# Patient Record
Sex: Male | Born: 1945 | Race: White | Hispanic: No | Marital: Married
Health system: Western US, Academic
[De-identification: ages and names within clinical notes are randomized; demographics above are authoritative.]

---

## 2018-04-18 ENCOUNTER — Emergency Department
Admission: EM | Admit: 2018-04-18 | Discharge: 2018-04-18 | Disposition: A | Discharge status: Home / Self Care | Payer: Medicare Other | Attending: Emergency Medicine | Admitting: Emergency Medicine

## 2018-04-18 ENCOUNTER — Emergency Department (EMERGENCY_DEPARTMENT_HOSPITAL): Payer: Medicare Other

## 2018-04-18 DIAGNOSIS — Z87820 Personal history of traumatic brain injury: Secondary | ICD-10-CM | POA: Insufficient documentation

## 2018-04-18 DIAGNOSIS — Z9889 Other specified postprocedural states: Secondary | ICD-10-CM

## 2018-04-18 DIAGNOSIS — G9389 Other specified disorders of brain: Secondary | ICD-10-CM

## 2018-04-18 DIAGNOSIS — E785 Hyperlipidemia, unspecified: Secondary | ICD-10-CM | POA: Insufficient documentation

## 2018-04-18 DIAGNOSIS — R42 Dizziness and giddiness: Secondary | ICD-10-CM

## 2018-04-18 DIAGNOSIS — Z8673 Personal history of transient ischemic attack (TIA), and cerebral infarction without residual deficits: Secondary | ICD-10-CM

## 2018-04-18 DIAGNOSIS — Y92410 Unspecified street and highway as the place of occurrence of the external cause: Secondary | ICD-10-CM | POA: Insufficient documentation

## 2018-04-18 LAB — CBC WITH DIFF, BLOOD
ANC-Automated: 2.3 10*3/uL (ref 1.6–7.0)
Abs Basophils: 0 10*3/uL (ref <0.1)
Abs Eosinophils: 0.1 10*3/uL (ref 0.1–0.5)
Abs Lymphs: 0.9 10*3/uL (ref 0.8–3.1)
Abs Monos: 0.3 10*3/uL (ref 0.2–0.8)
Basophils: 0 %
Eosinophils: 3 %
Hct: 46 % (ref 40.0–50.0)
Hgb: 15.4 gm/dL (ref 13.7–17.5)
Lymphocytes: 24 %
MCH: 28.7 pg (ref 26.0–32.0)
MCHC: 33.5 g/dL (ref 32.0–36.0)
MCV: 85.7 um3 (ref 79.0–95.0)
MPV: 11.1 fL (ref 9.4–12.4)
Monocytes: 9 %
Plt Count: 156 10*3/uL (ref 140–370)
RBC: 5.37 10*6/uL (ref 4.60–6.10)
RDW: 13.2 % (ref 12.0–14.0)
Segs: 64 %
WBC: 3.7 10*3/uL — ABNORMAL LOW (ref 4.0–10.0)

## 2018-04-18 LAB — COMPREHENSIVE METABOLIC PANEL, BLOOD
ALT (SGPT): 25 U/L (ref 0–41)
AST (SGOT): 25 U/L (ref 0–40)
Albumin: 4.3 g/dL (ref 3.5–5.2)
Alkaline Phos: 67 U/L (ref 40–129)
Anion Gap: 9 mmol/L (ref 7–15)
BUN: 18 mg/dL (ref 8–23)
Bicarbonate: 24 mmol/L (ref 22–29)
Bilirubin, Tot: 0.35 mg/dL (ref <1.2)
Calcium: 9.1 mg/dL (ref 8.5–10.6)
Chloride: 107 mmol/L (ref 98–107)
Creatinine: 0.88 mg/dL (ref 0.67–1.17)
GFR: >60 mL/min
Glucose: 115 mg/dL — ABNORMAL HIGH (ref 70–99)
Potassium: 4.1 mmol/L (ref 3.5–5.1)
Sodium: 140 mmol/L (ref 136–145)
Total Protein: 6.7 g/dL (ref 6.0–8.0)

## 2018-04-18 LAB — ECG 12-LEAD
ATRIAL RATE: 68 {beats}/min
ECG INTERPRETATION: NORMAL
P AXIS: 36 degrees
PR INTERVAL: 170 ms
QRS INTERVAL/DURATION: 92 ms
QT: 392 ms
QTC INTERVAL: 416 ms
R AXIS: 27 degrees
T AXIS: 39 degrees
VENTRICULAR RATE: 68 {beats}/min

## 2018-04-18 LAB — HCV ANTIBODY WITH REFLEX QUANT: Hepatitis C Ab: NONREACTIVE
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
## 2018-04-18 NOTE — ED Notes (Signed)
04/18/2018 8:08 AM Henry Jacobs    An EKG was handed to Dr. Jessee AversNene.

## 2018-04-20 NOTE — ED Follow-up Note (Signed)
Follow-up type: Callback       Routine ED Patient Call Back    Patient contacted by telephone:  found no issues; patient doing fine. No recurrence of headache, denies n/v/dizziness, weakness. Advised PMD follow up once returns home.      Elon Spannerahul Onisha Cedeno, MD, PhD  Emergency Medicine, PGY-3

## 2019-11-01 ENCOUNTER — Encounter (INDEPENDENT_AMBULATORY_CARE_PROVIDER_SITE_OTHER): Payer: Self-pay

## 2021-05-07 IMAGING — CR XR CHEST 1 VIEW
1 series · 1 of 1 positions shown · non-contrast
Comparison: none

FINAL REPORT:
INDICATION: Near syncope. Chest pain.
Procedure: Portable chest.
Comparisons: None

[AP]
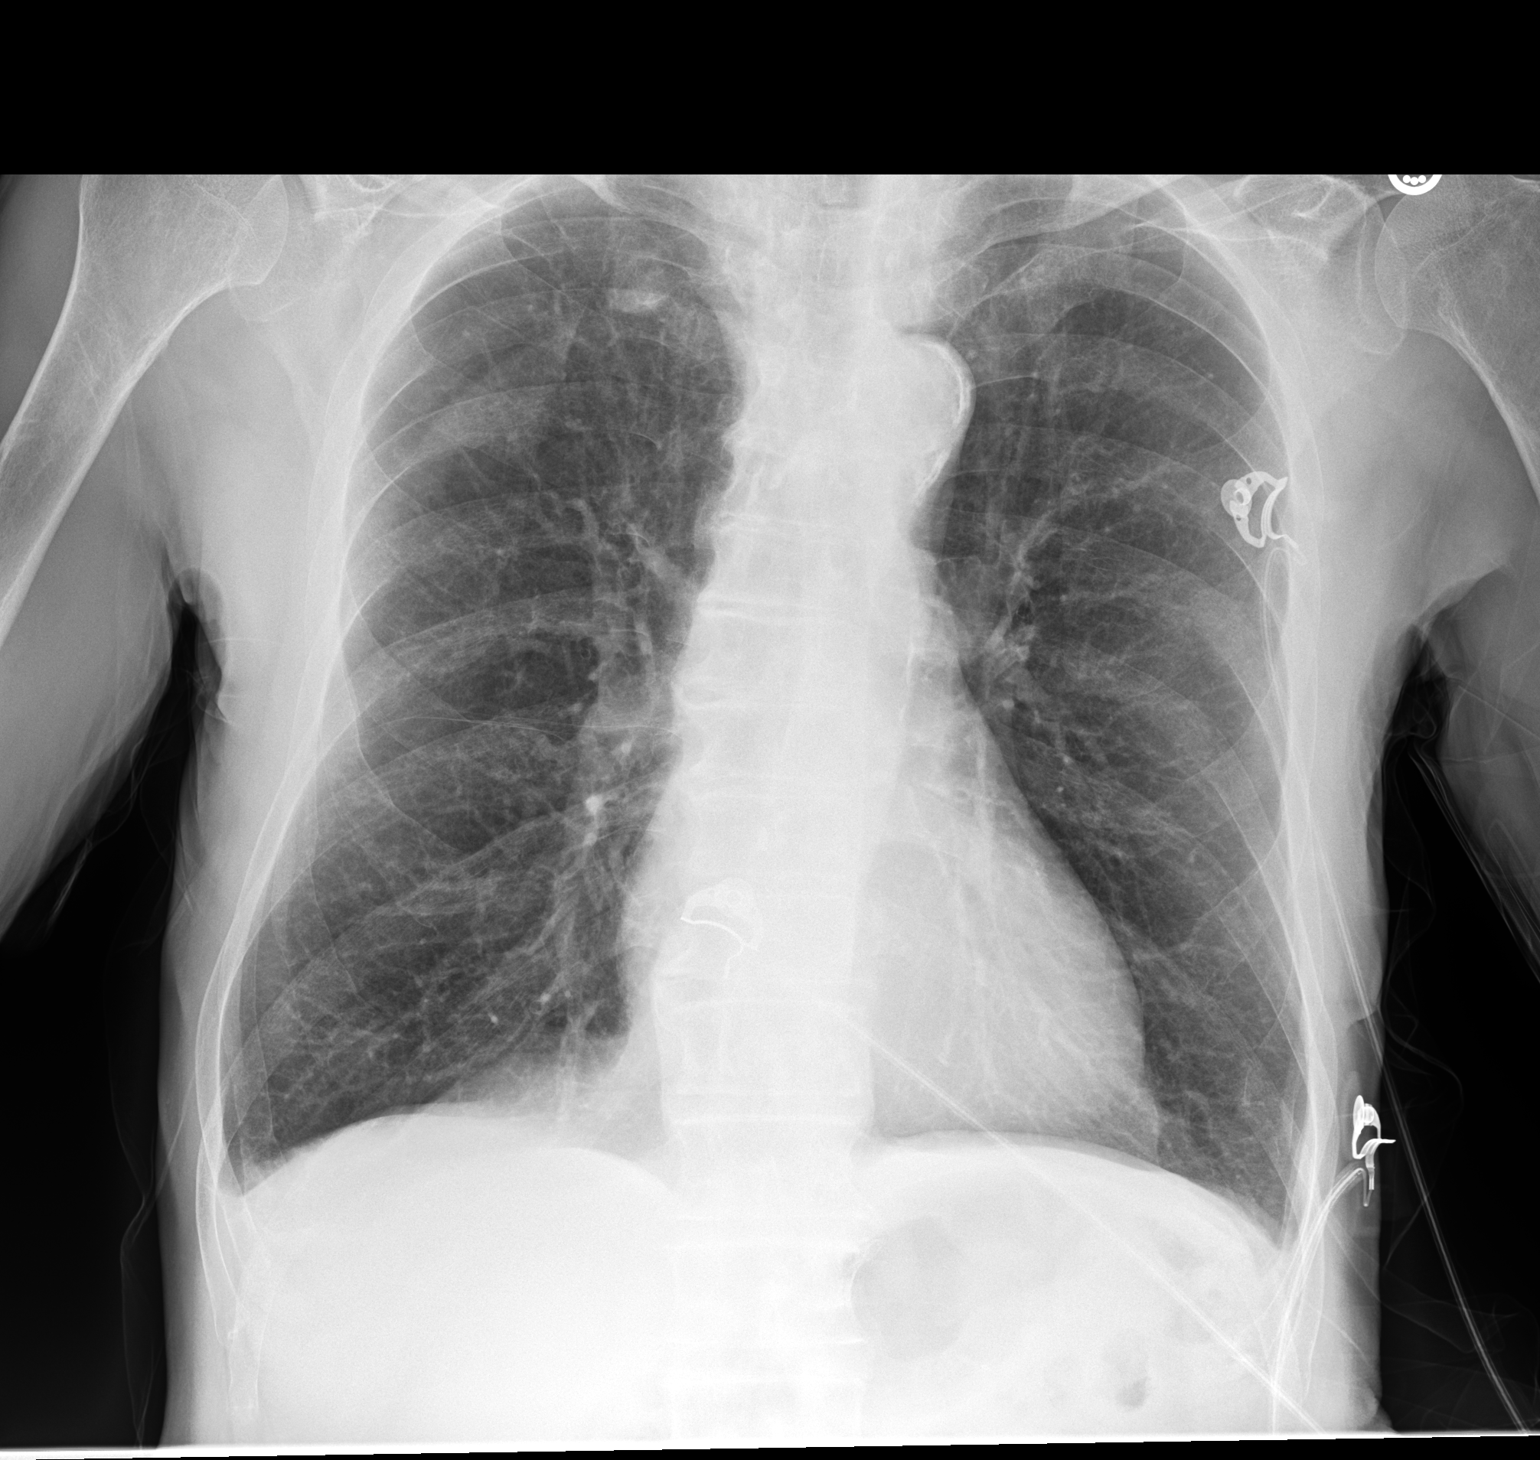

[1 of 1 positions shown; findings below may reference images not displayed]

FINDINGS: The lungs are symmetrically hyperinflated and clear. The cardiac silhouette size is within normal limits.  No pulmonary edema. The CP angles are sharp. There is no evidence of a pneumothorax.
IMPRESSION: 
IMPRESSION: No acute chest disease demonstrated.

## 2021-08-28 IMAGING — CT CT HEAD WITHOUT CONTRAST
2 of 3 series · 15 of 40 positions shown, 18 images · non-contrast
Comparison: none

Numbness
No sx on head
Bladder cancer, sx
Addendum:
(#[id number]
<[HOSPITAL]>
Communicated to: Dr. Najy
On behalf of: Dr. Brad Raymundo
By: Melynda Billiot
At: [DATE]
On: 08/28/2021
</[HOSPITAL]>#)
FINAL REPORT:
CT HEAD WITHOUT CONTRAST
INDICATION: Neuro deficit, acute, stroke suspected
TECHNIQUE: Helical CT images from skull base to vertex without IV contrast. Dose reduction techniques were utilized for this examination.

[Series 2: head stnd · axial · 0.44mm/px · z∈[+29,+163]mm · 12 of 32 slices shown, 15 images]
[im 3/32  brain]
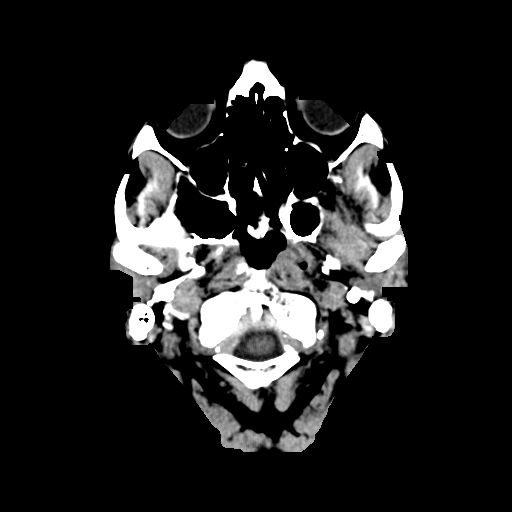
[im 3/32  bone]
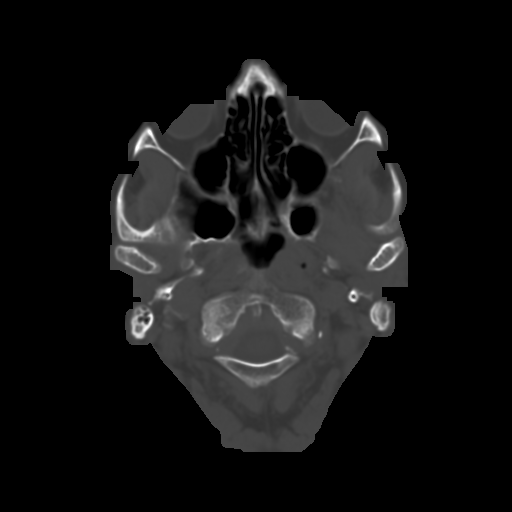
[im 5/32  brain]
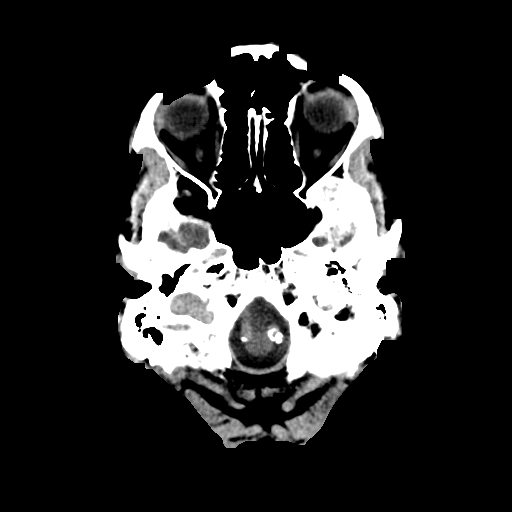
[im 7/32  brain]
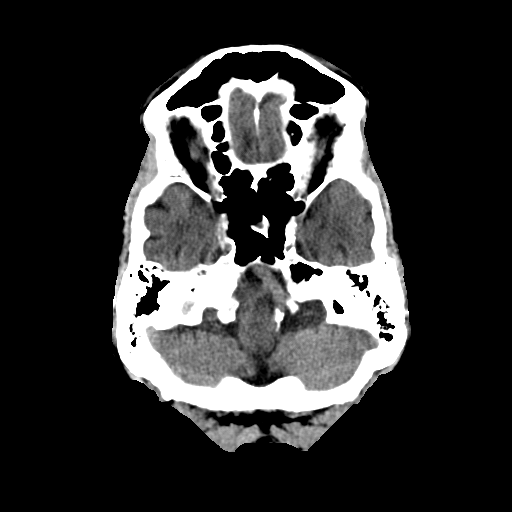
[im 10/32  brain]
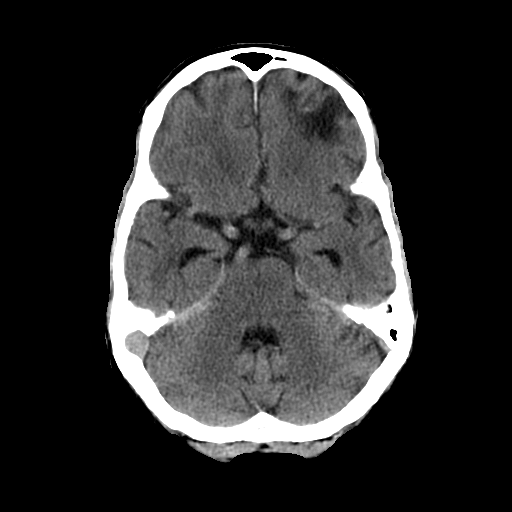
[im 12/32  brain]
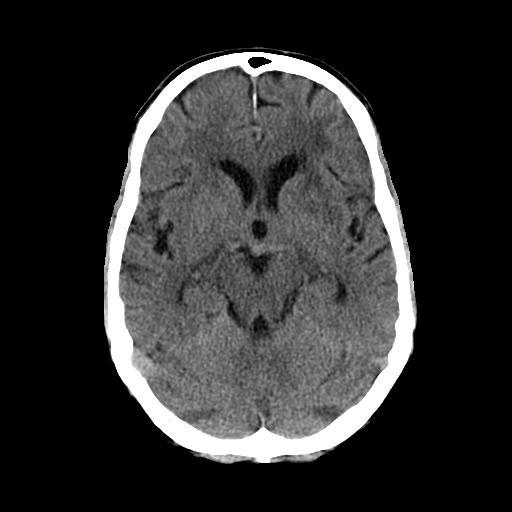
[im 12/32  bone]
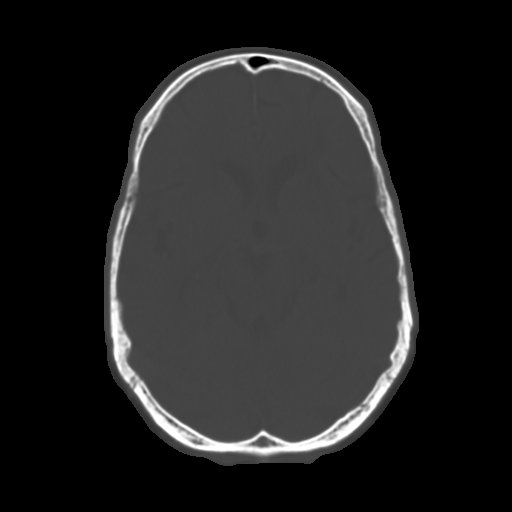
[im 14/32  brain]
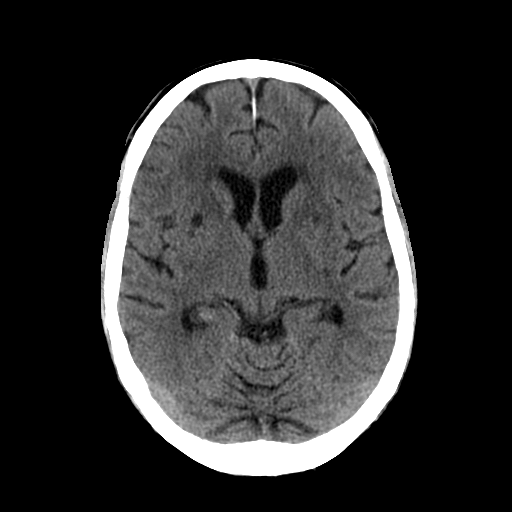
[im 18/32  brain]
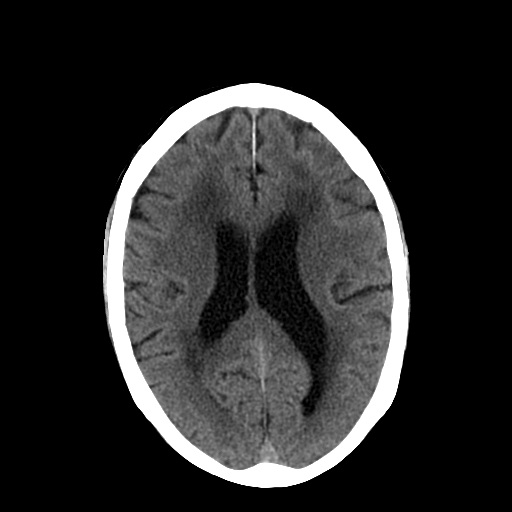
[im 20/32  brain]
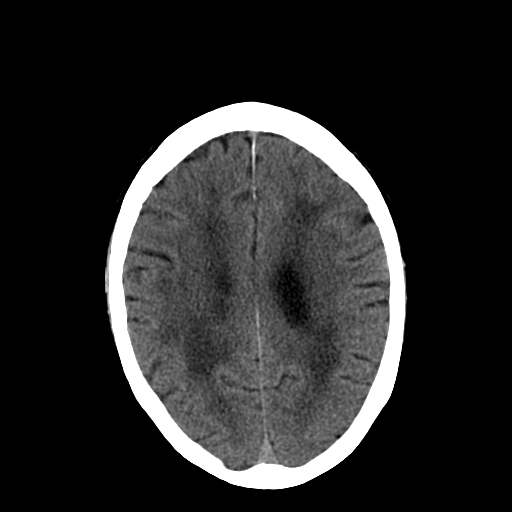
[im 22/32  brain]
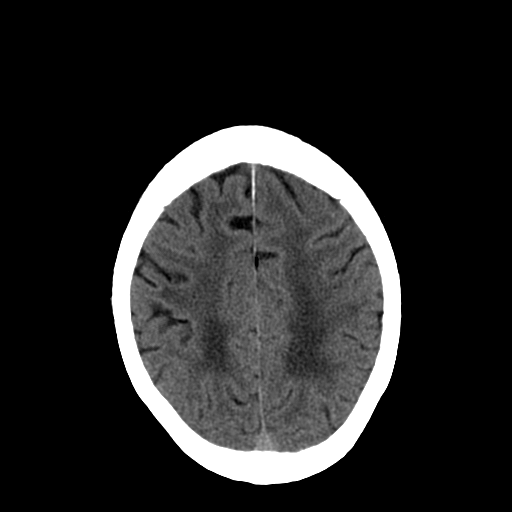
[im 22/32  bone]
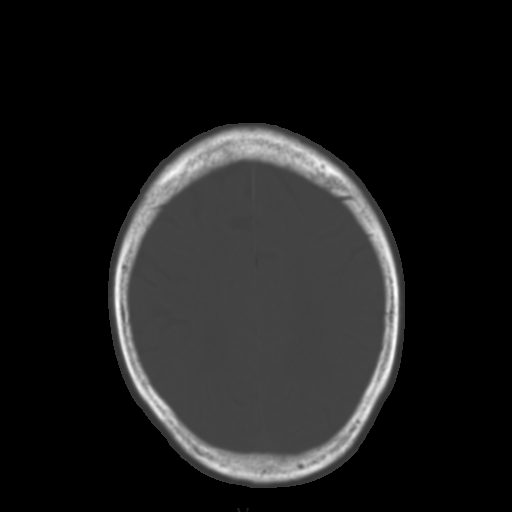
[im 25/32  brain]
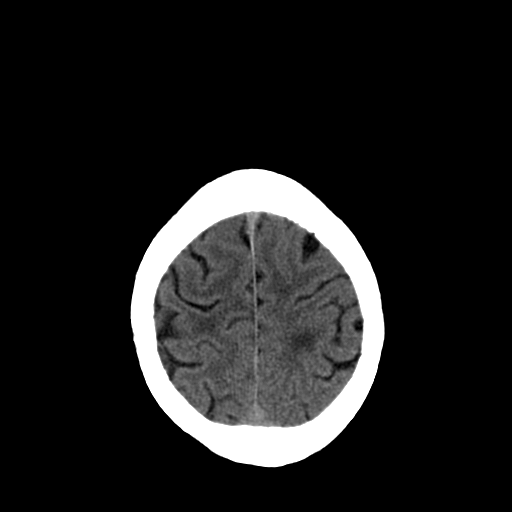
[im 27/32  brain]
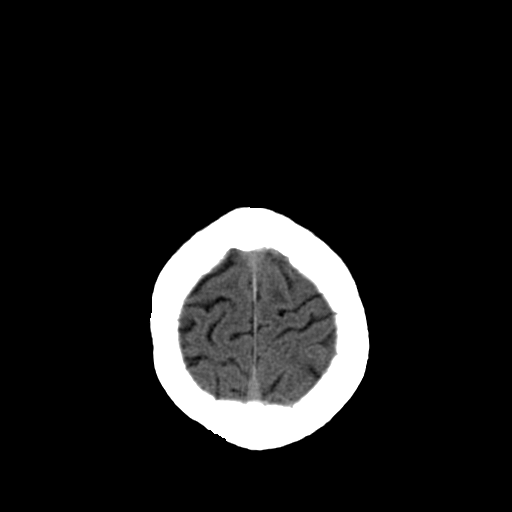
[im 29/32  brain]
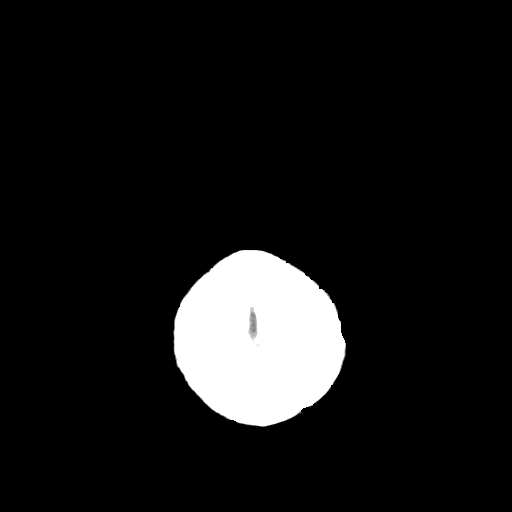

[Series 601: cor head · coronal · 0.44mm/px · 3 of 103 slices shown]
[im 28/103  brain]
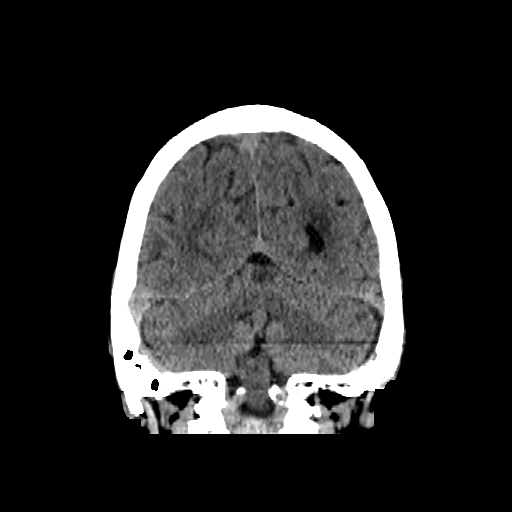
[im 40/103  brain]
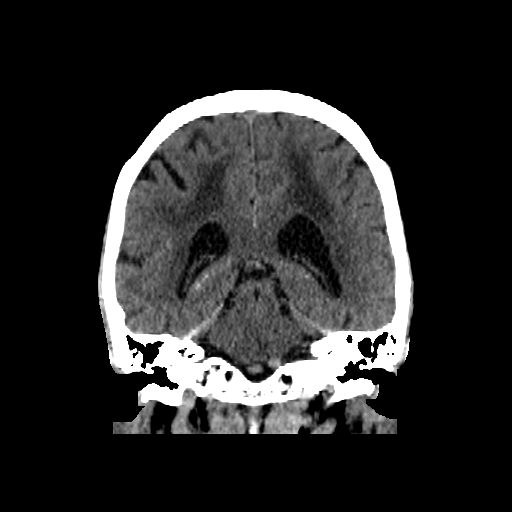
[im 53/103  brain]
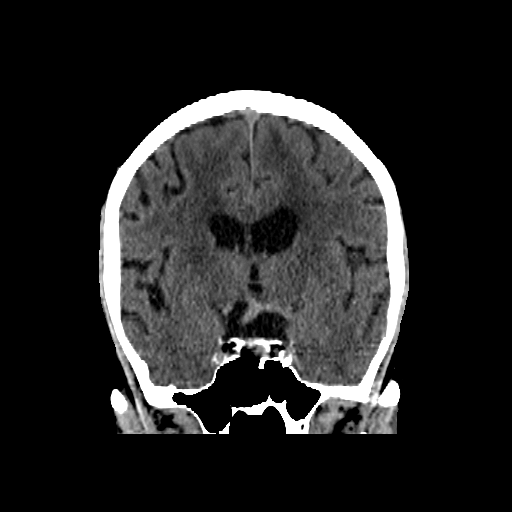

[15 of 40 positions shown; findings below may reference images not displayed]

FINDINGS: Parenchyma: No acute large territory infarction. No acute hemorrhage. No mass or mass effect. Patchy and confluent areas of hypoattenuation are present in the cerebral white matter that are nonspecific but compatible with moderate chronic microvascular ischemic changes.
Encephalomalacia in the left anterior-inferior frontal lobe.
Old lacunar infarctions in the bilateral basal ganglia.
Extra-axial Collection:  None
Ventricular System:  Normal
Dural Venous Sinuses:  Normal
Osseous Structures:  Normal
Included Orbits: Normal
Paranasal Sinuses:  Predominantly clear
Tympanomastoid Cavities:  Normal
Other:  Atherosclerotic intracranial calcifications are present.
IMPRESSION: :
No acute intracranial abnormality.
Moderate chronic white matter microvascular ischemic changes.
Old infarction in the left anterior inferior frontal lobe. Old lacunar infarctions in the bilateral basal ganglia.

## 2022-05-07 IMAGING — CR XR CHEST 1 VIEW
1 series · 1 of 1 positions shown · non-contrast
Comparison: Chest x-ray from 05/07/2021

FINAL REPORT:
INDICATION: fall pain

[AP]
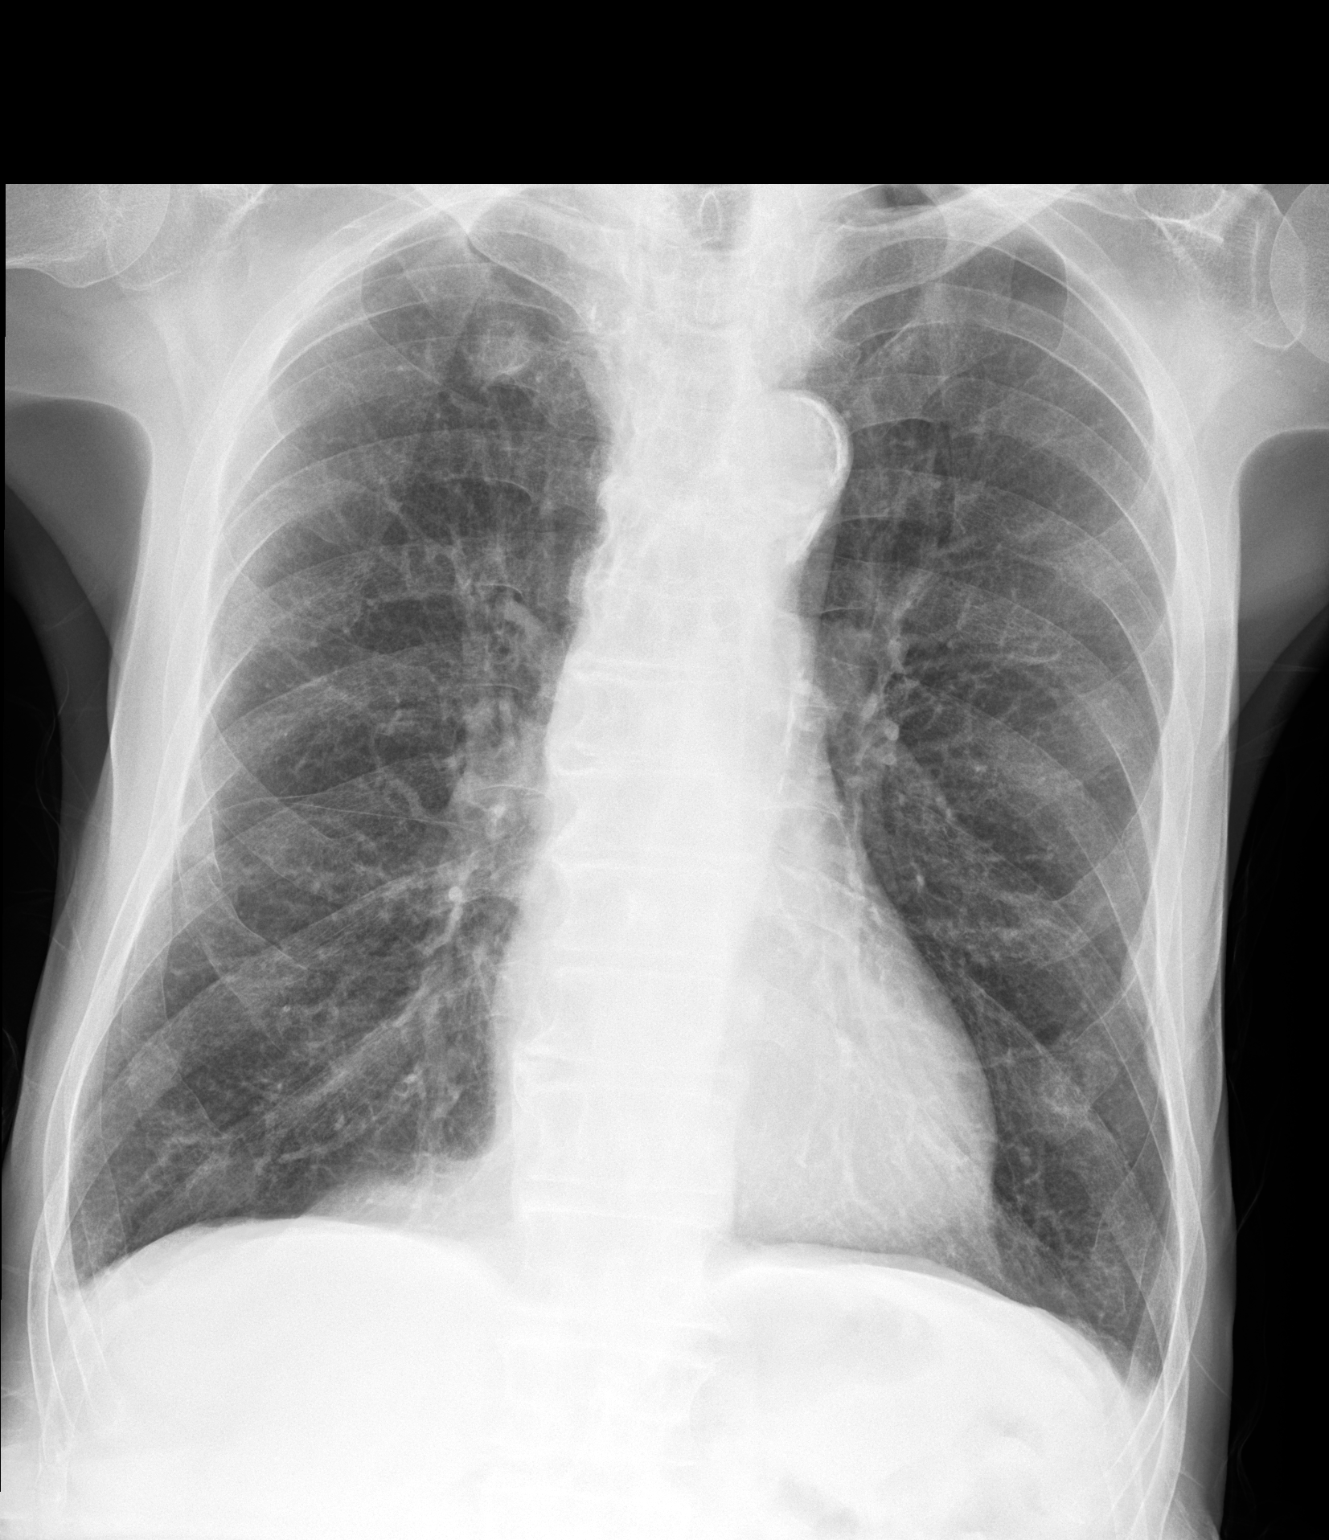

[1 of 1 positions shown; findings below may reference images not displayed]

FINDINGS: 1 view of the chest. The cardiomediastinal contours are within normal limits. No evidence of focal consolidation, pleural effusion, pulmonary edema, or pneumothorax.
IMPRESSION: 
IMPRESSION: No acute cardiopulmonary findings.

## 2022-05-18 IMAGING — CR XR CHEST 1 VIEW
1 series · 1 of 1 positions shown · non-contrast
Comparison: 05/07/2022

FINAL REPORT:
Chest portable one view
INDICATION: cp

[AP]
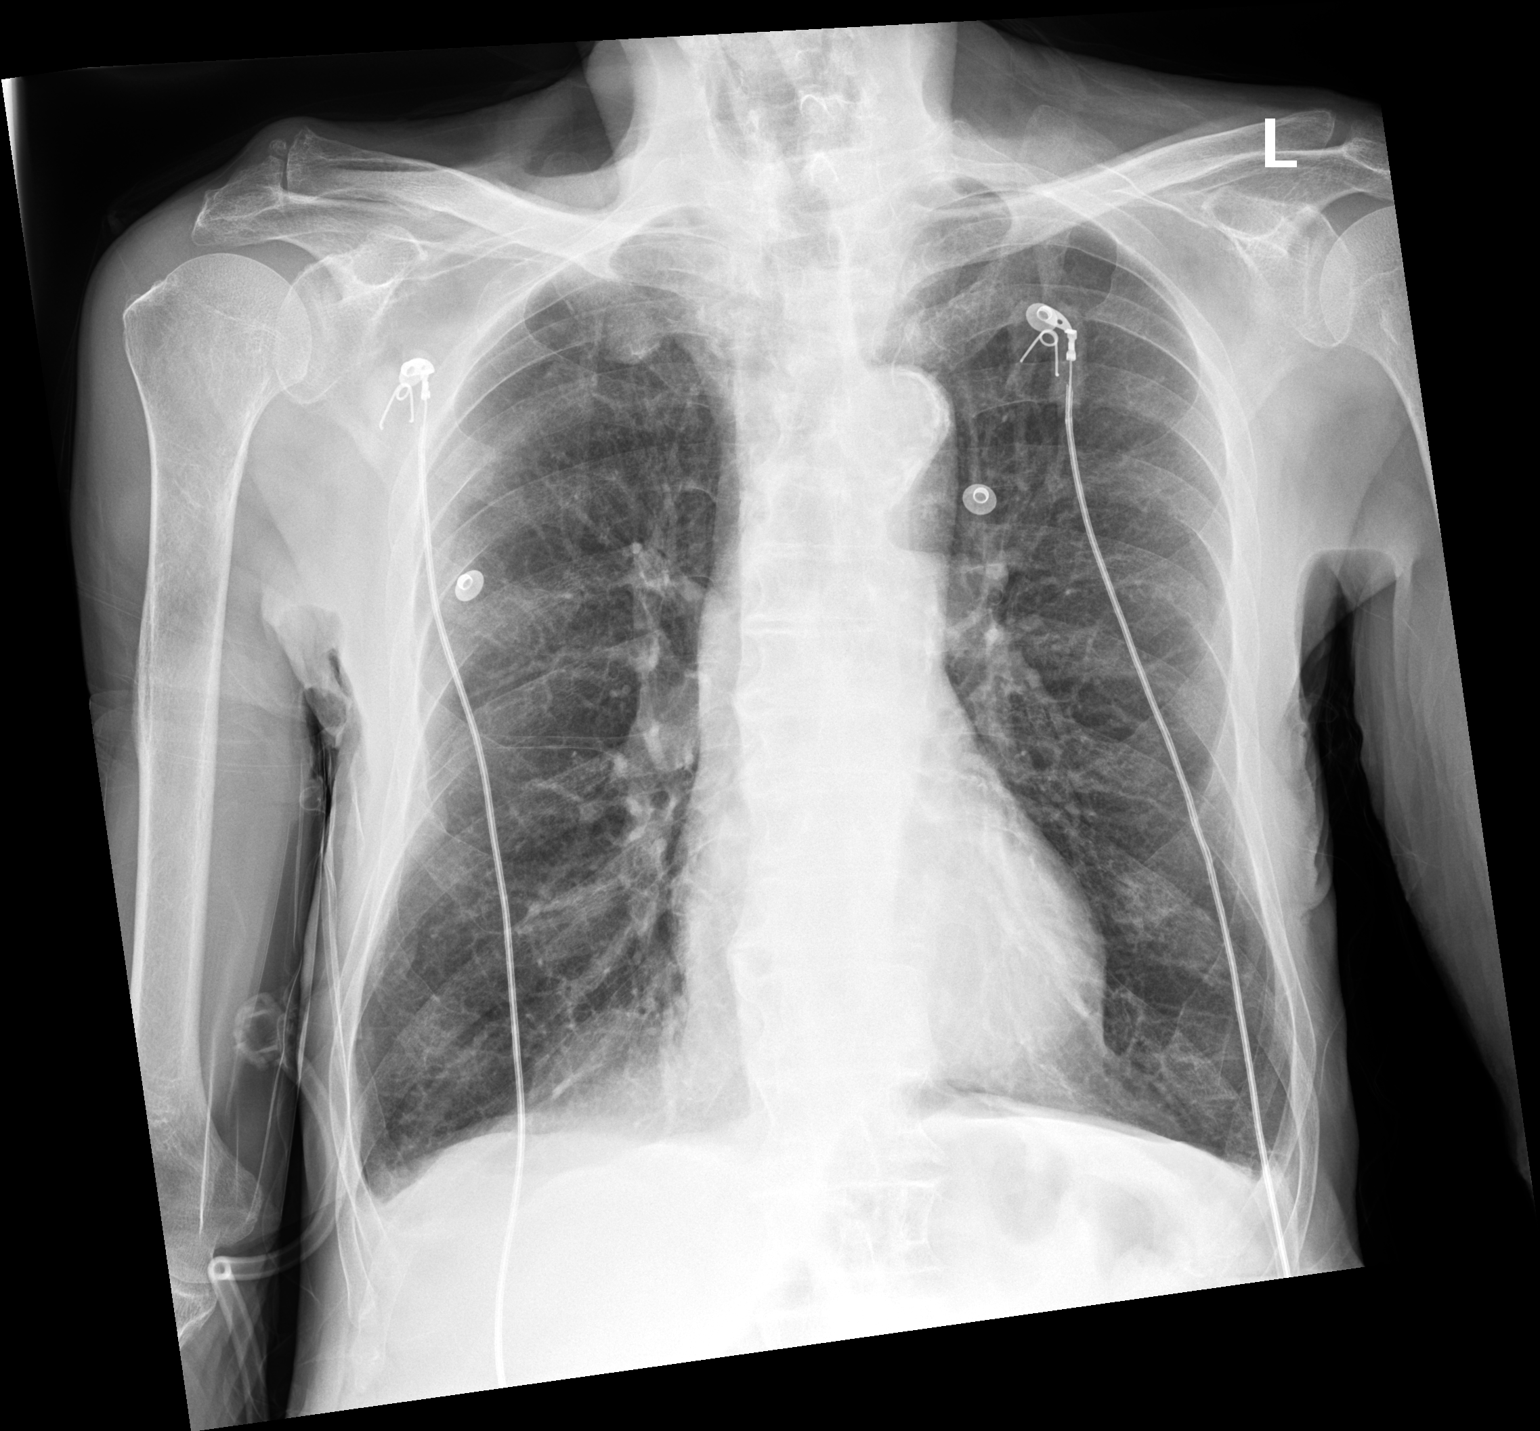

[1 of 1 positions shown; findings below may reference images not displayed]

FINDINGS: Inflated but clear.  The cardiac silhouette is within normal limits  given portable technique.  No acute osseous abnormalities.
IMPRESSION: 
IMPRESSION: No acute cardiopulmonary process.
Portable?
Yes
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
## 2022-05-19 IMAGING — CR XR KNEE 3 VIEWS LEFT
1 series · 2 of 2 positions shown · non-contrast
Comparison: None.

FINAL REPORT:
XR KNEE 3 VIEWS LEFT
INDICATION: Knee pain.
TECHNIQUE: Multiple views of the left knee were obtained.

[Series 5752: AP · right · 2 of 2 slices shown]
[im 1/2]
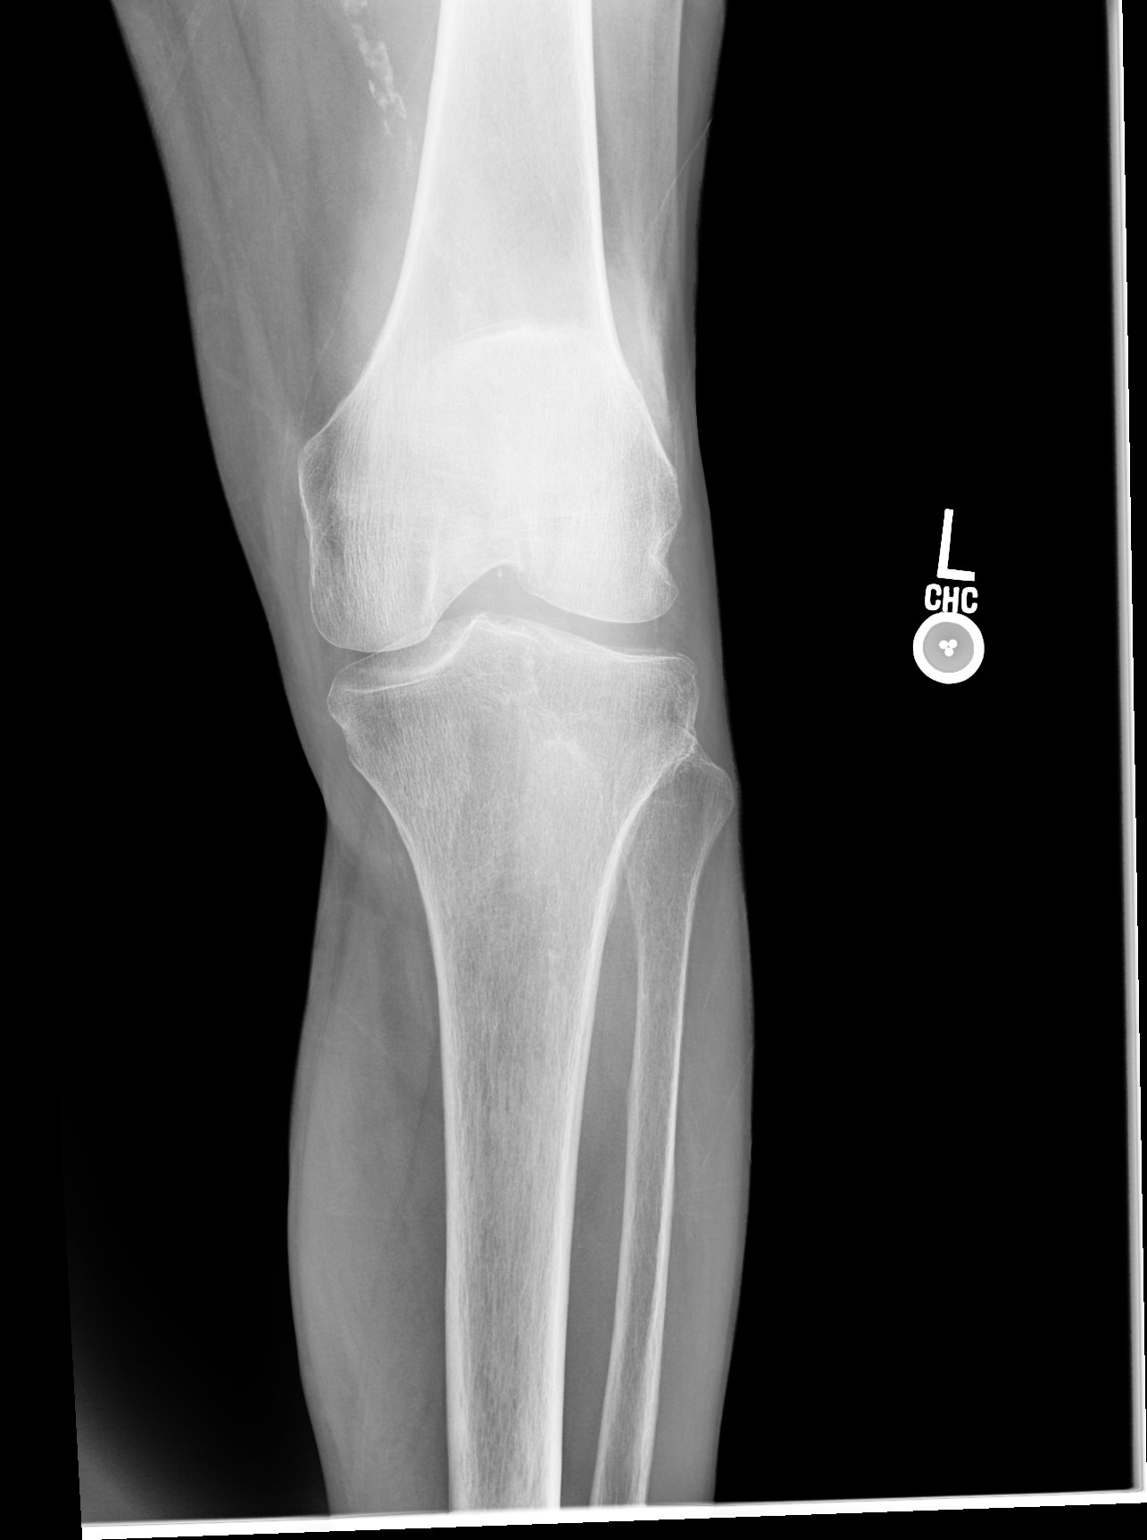
[im 2/2]
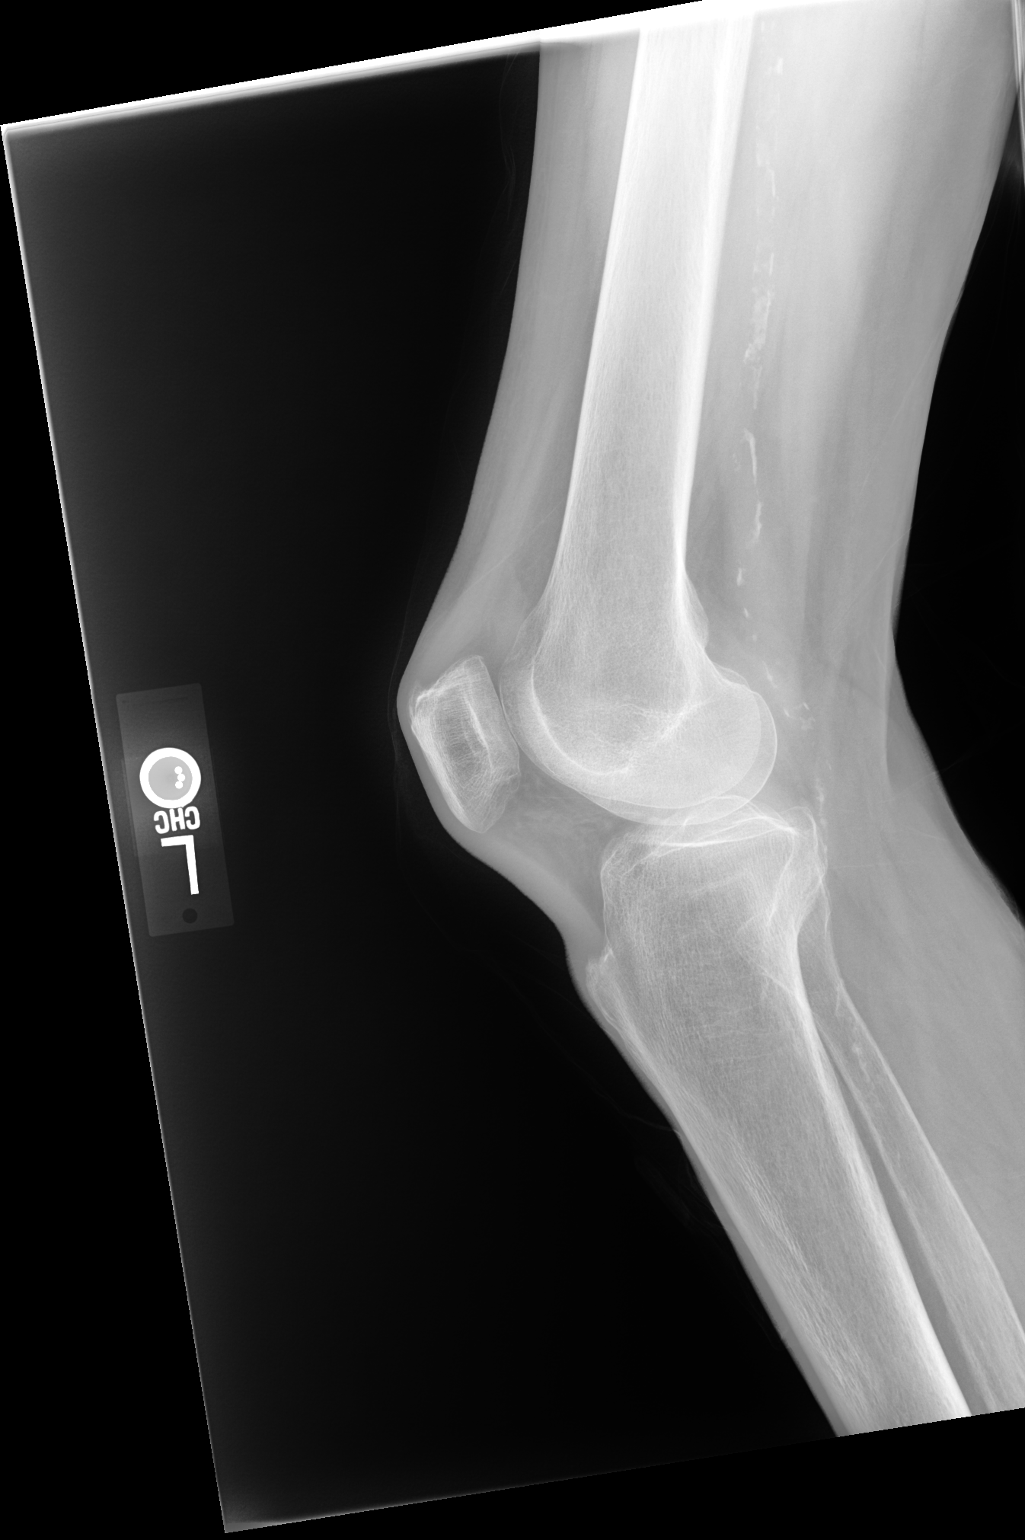

[2 of 2 positions shown; findings below may reference images not displayed]

FINDINGS: The bone mineralization is normal. No acute fracture or dislocation. No significant joint space narrowing or osteophytosis. Normal alignment of the patella. Tiny suprapatellar joint effusion. No radiopaque foreign body or subcutaneous emphysema in the soft tissue. Vascular calcifications are visualized.
IMPRESSION: No acute fracture or dislocation of the left knee. Tiny left knee joint effusion.
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
## 2022-07-11 IMAGING — CR XR CHEST 1 VIEW
1 series · 1 of 1 positions shown · non-contrast
Comparison: 05/18/2022

FINAL REPORT:
Chest portable one view
INDICATION: chest pain

[AP]
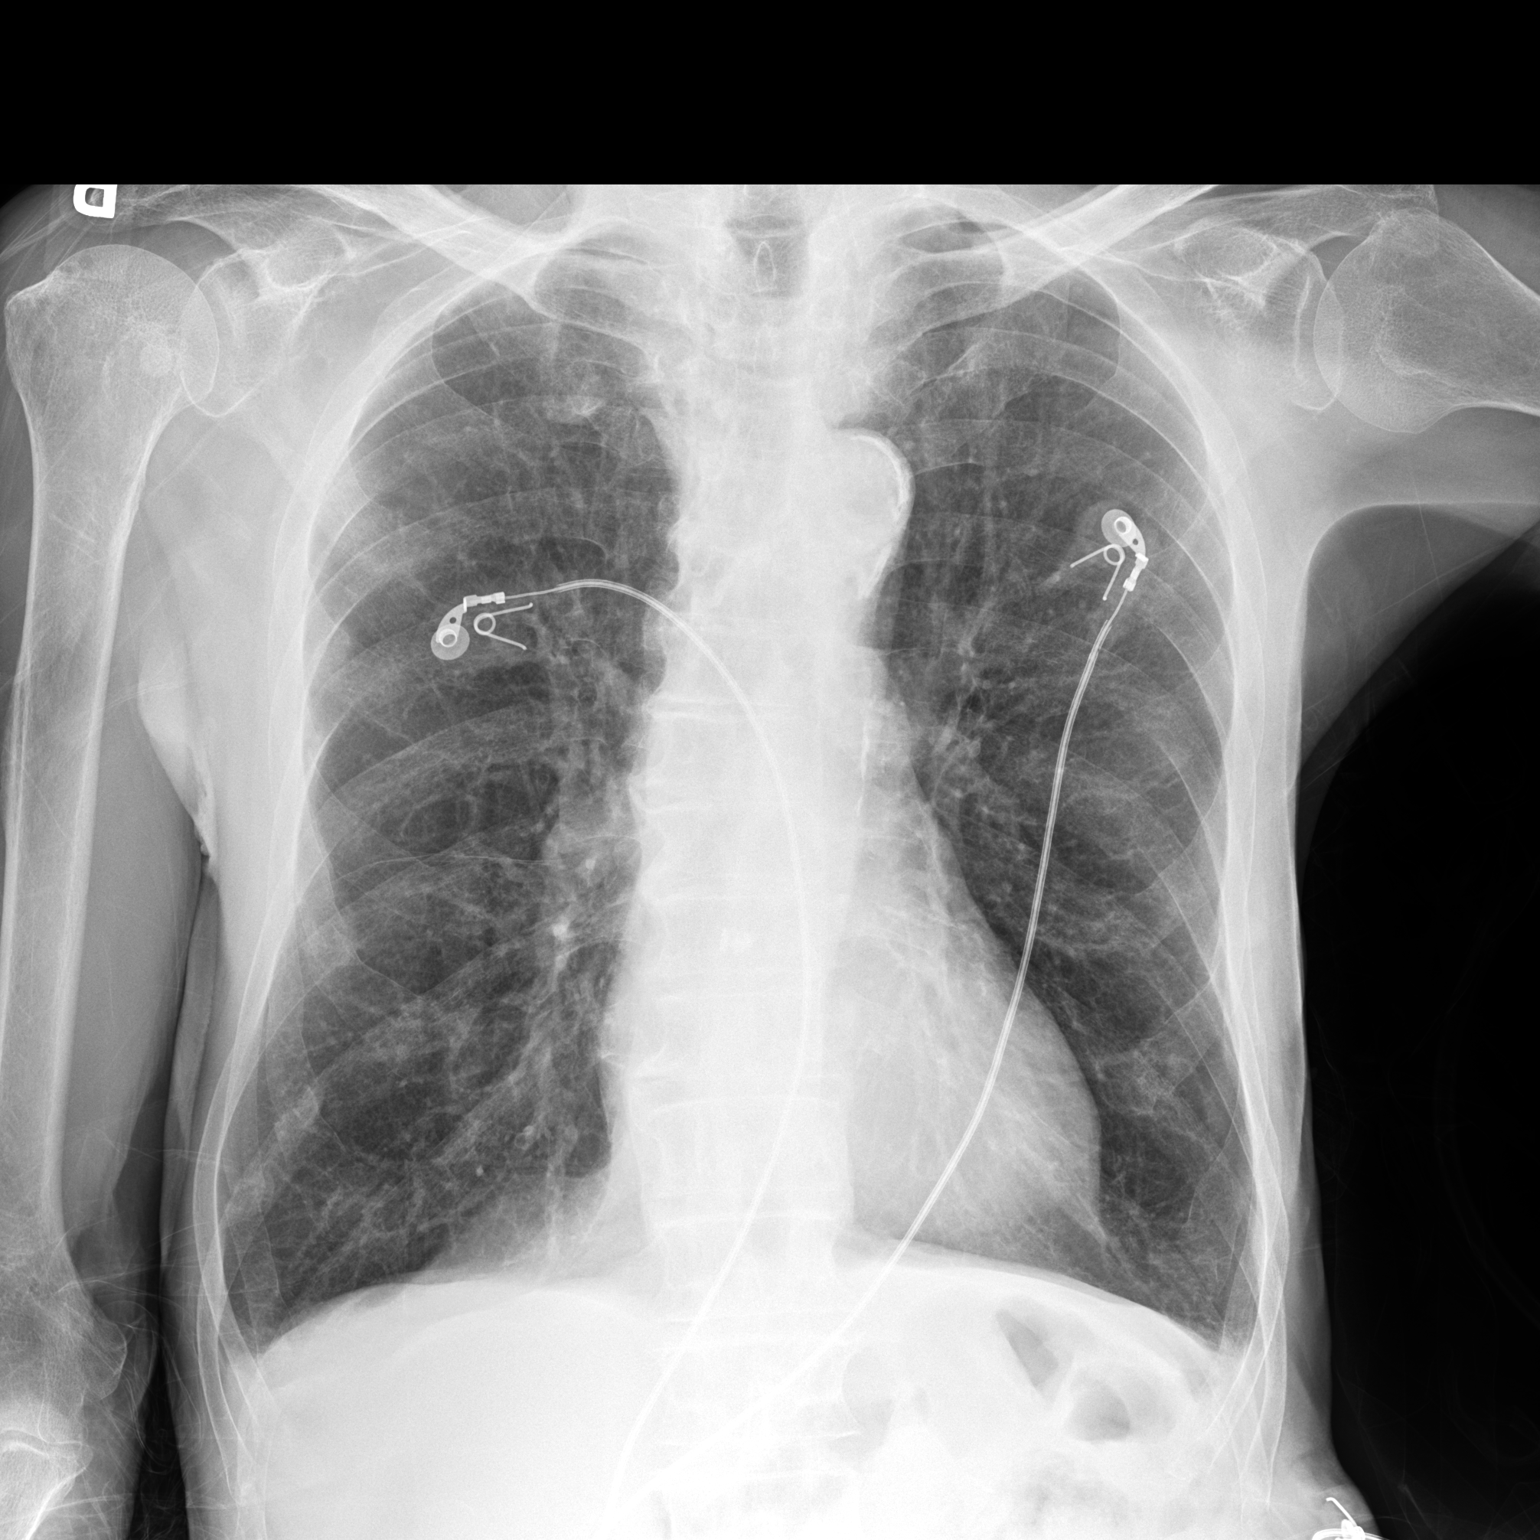

[1 of 1 positions shown; findings below may reference images not displayed]

FINDINGS: The lungs are hyperinflated but clear  The cardiac silhouette is within normal limits  given portable technique.  No acute osseous abnormalities.
IMPRESSION: 
IMPRESSION: No acute cardiopulmonary process.
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
## 2022-10-10 IMAGING — CT CT HEAD WITHOUT CONTRAST
3 of 4 series · 16 of 47 positions shown, 19 images · non-contrast
Comparison: 07/11/2022.

Fall causing a broken hip. Pt stated he hit his head as well.
FINAL REPORT:
CT head without contrast
HISTORY: FALL  - HIT RIGHT SIDE OF HEAD
TECHNIQUE: CT of the head was performed without IV contrast, and axial computed tomography images were taken between the skull base and vertex. Bone windows and soft tissue windows are available for interpretation. All CT scans at this facility use dose modulation and/or weight based dosing when appropriate to reduce radiation dose to as low as reasonably achievable.

[Series 2: brain without · axial · non-contrast · 0.45mm/px · z∈[+1025,+1160]mm · 10 of 33 slices shown, 13 images]
[im 3/33  brain]
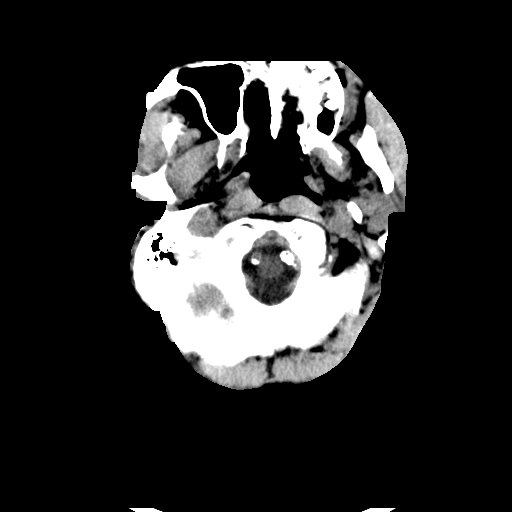
[im 3/33  bone]
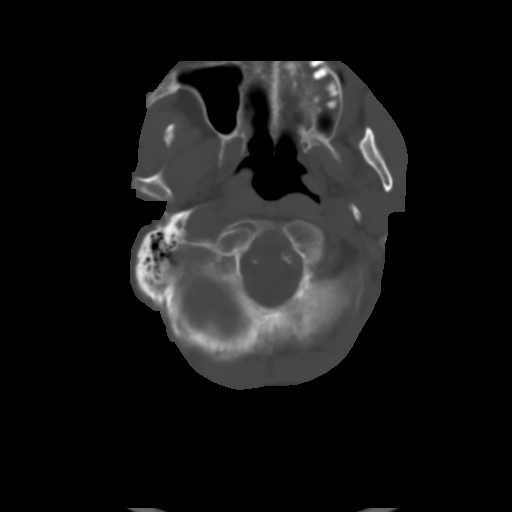
[im 5/33  brain]
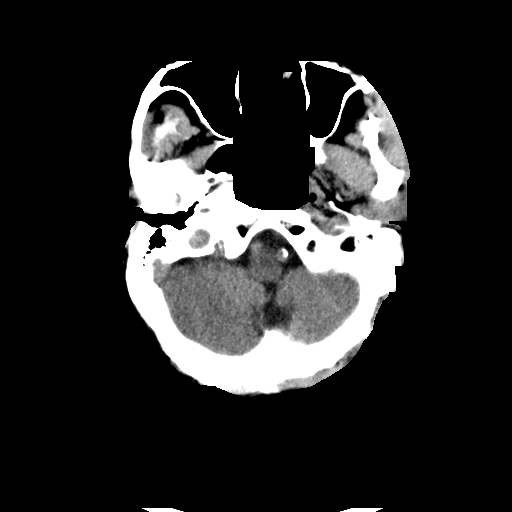
[im 10/33  brain]
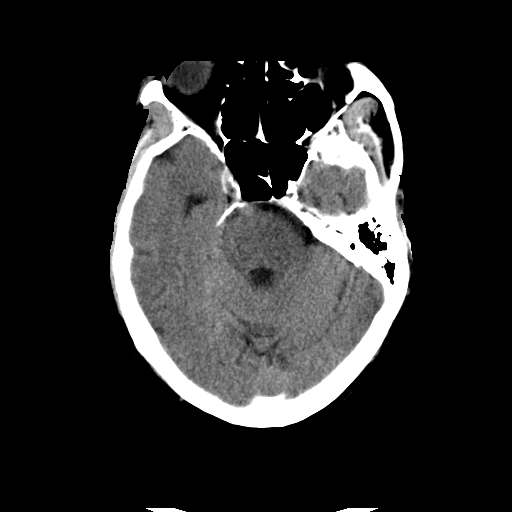
[im 12/33  brain]
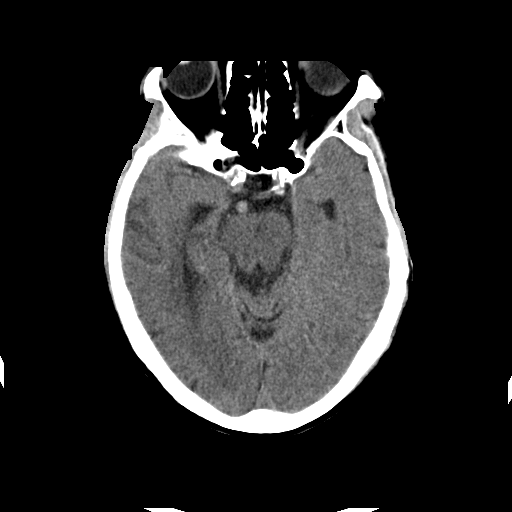
[im 14/33  brain]
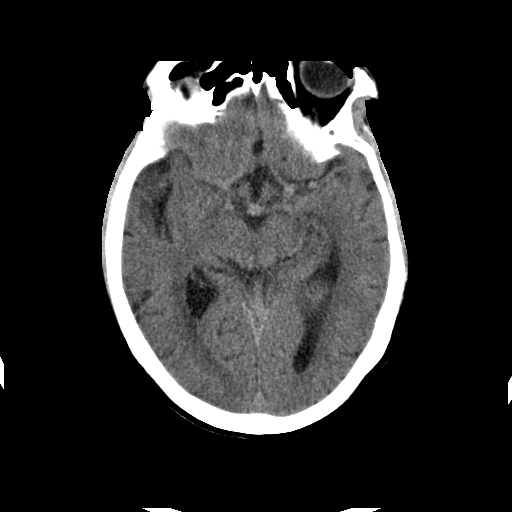
[im 14/33  bone]
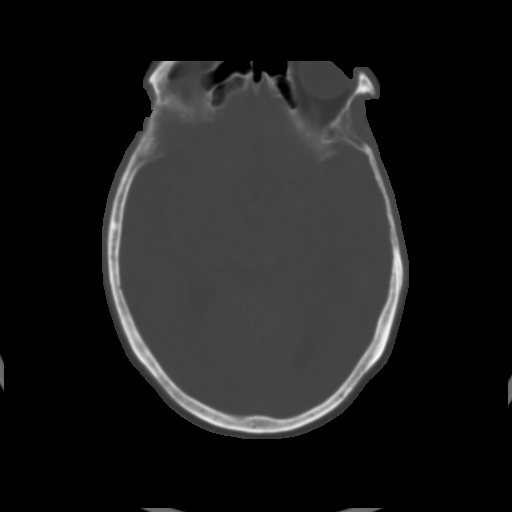
[im 19/33  brain]
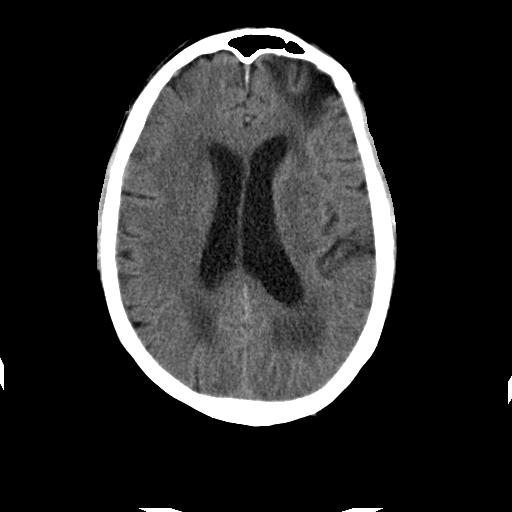
[im 21/33  brain]
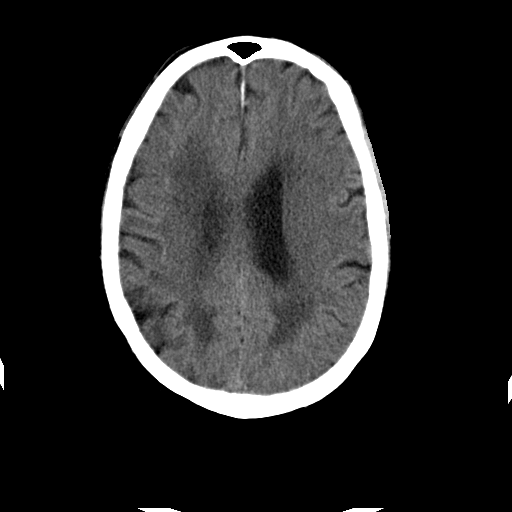
[im 23/33  brain]
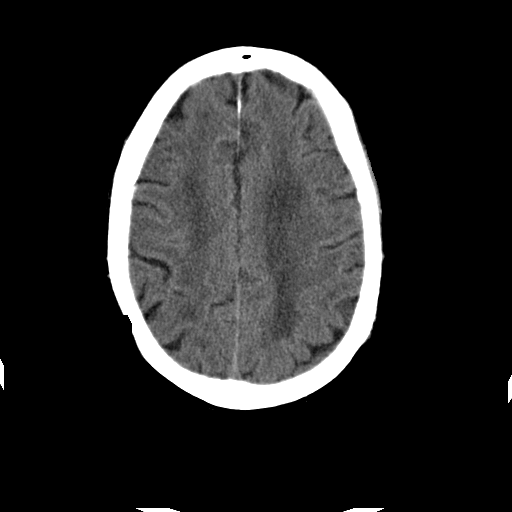
[im 28/33  brain]
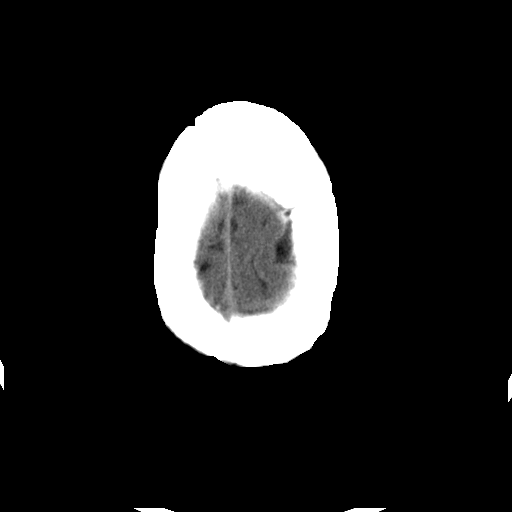
[im 28/33  bone]
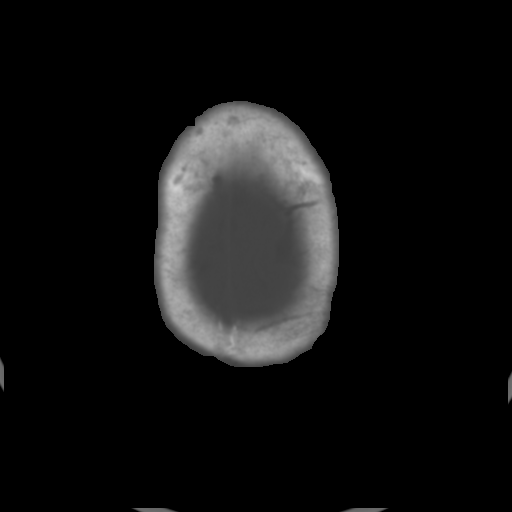
[im 30/33  brain]
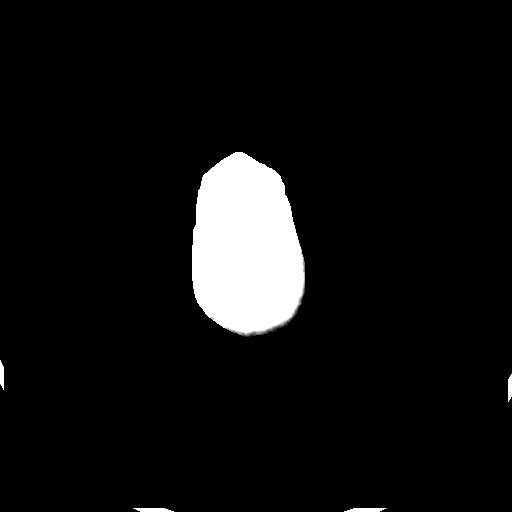

[mpr, brain thin, coronal · coronal · 0.45mm/px · 3 of 92 slices shown]
[im 31/92  brain]
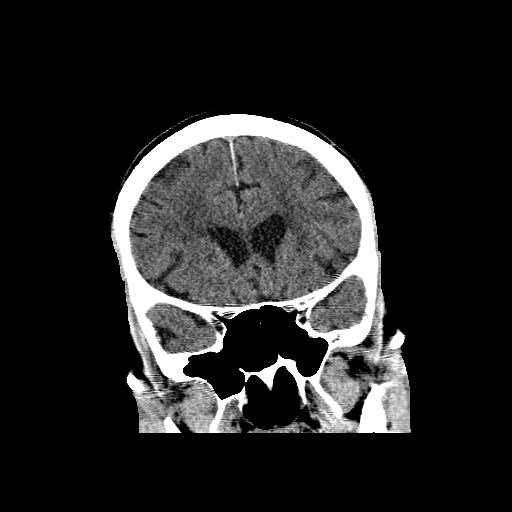
[im 41/92  brain]
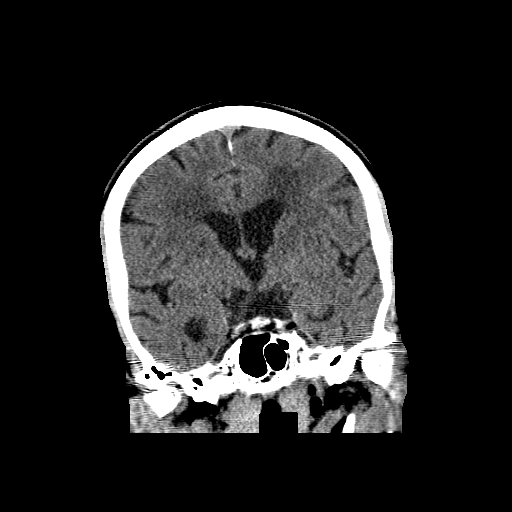
[im 51/92  brain]
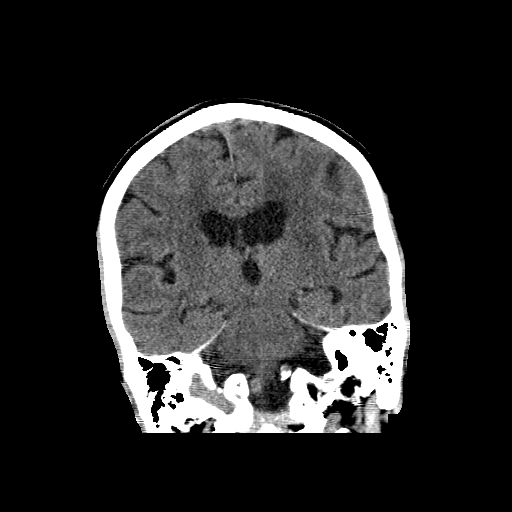

[mpr, brain thin, sagittal · sagittal · 0.45mm/px · 3 of 77 slices shown]
[im 26/77  brain]
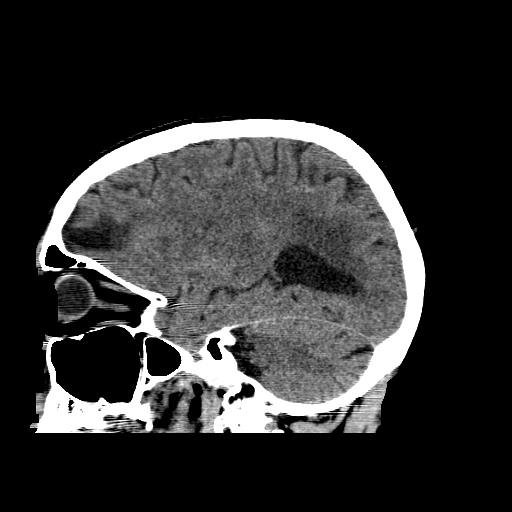
[im 39/77  brain]
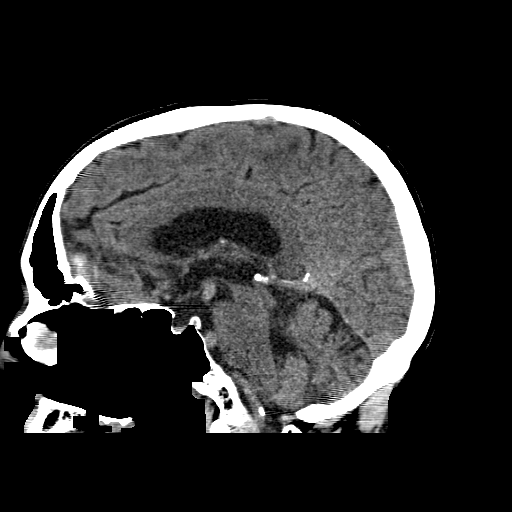
[im 51/77  brain]
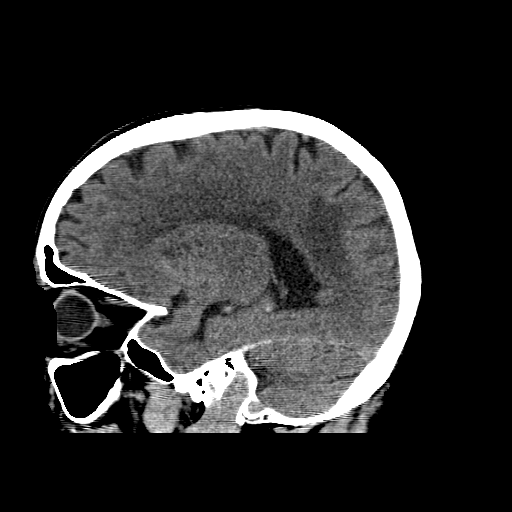

[16 of 47 positions shown; findings below may reference images not displayed]

FINDINGS: No hemorrhage, midline shift, or mass effect.
No evidence of transcortical infarction. Confluent hypodensities in the white matter are nonspecific, likely due to chronic small vessel ischemic disease. There are vascular calcifications. Chronic left frontal lobe infarct. Chronic lacunar infarcts in the basal ganglia.
No hydrocephalus.
Visualized paranasal sinuses are clear.
Orbits are unremarkable.
No calvarial fracture.
IMPRESSION: 
IMPRESSION: No acute intracranial abnormality.

## 2022-10-10 IMAGING — CR XR CHEST 1 VIEW
1 series · 1 of 1 positions shown · non-contrast
Comparison: 07/11/2022

FINAL REPORT:
Chest portable one view
INDICATION: fall/ hip pain

[AP]
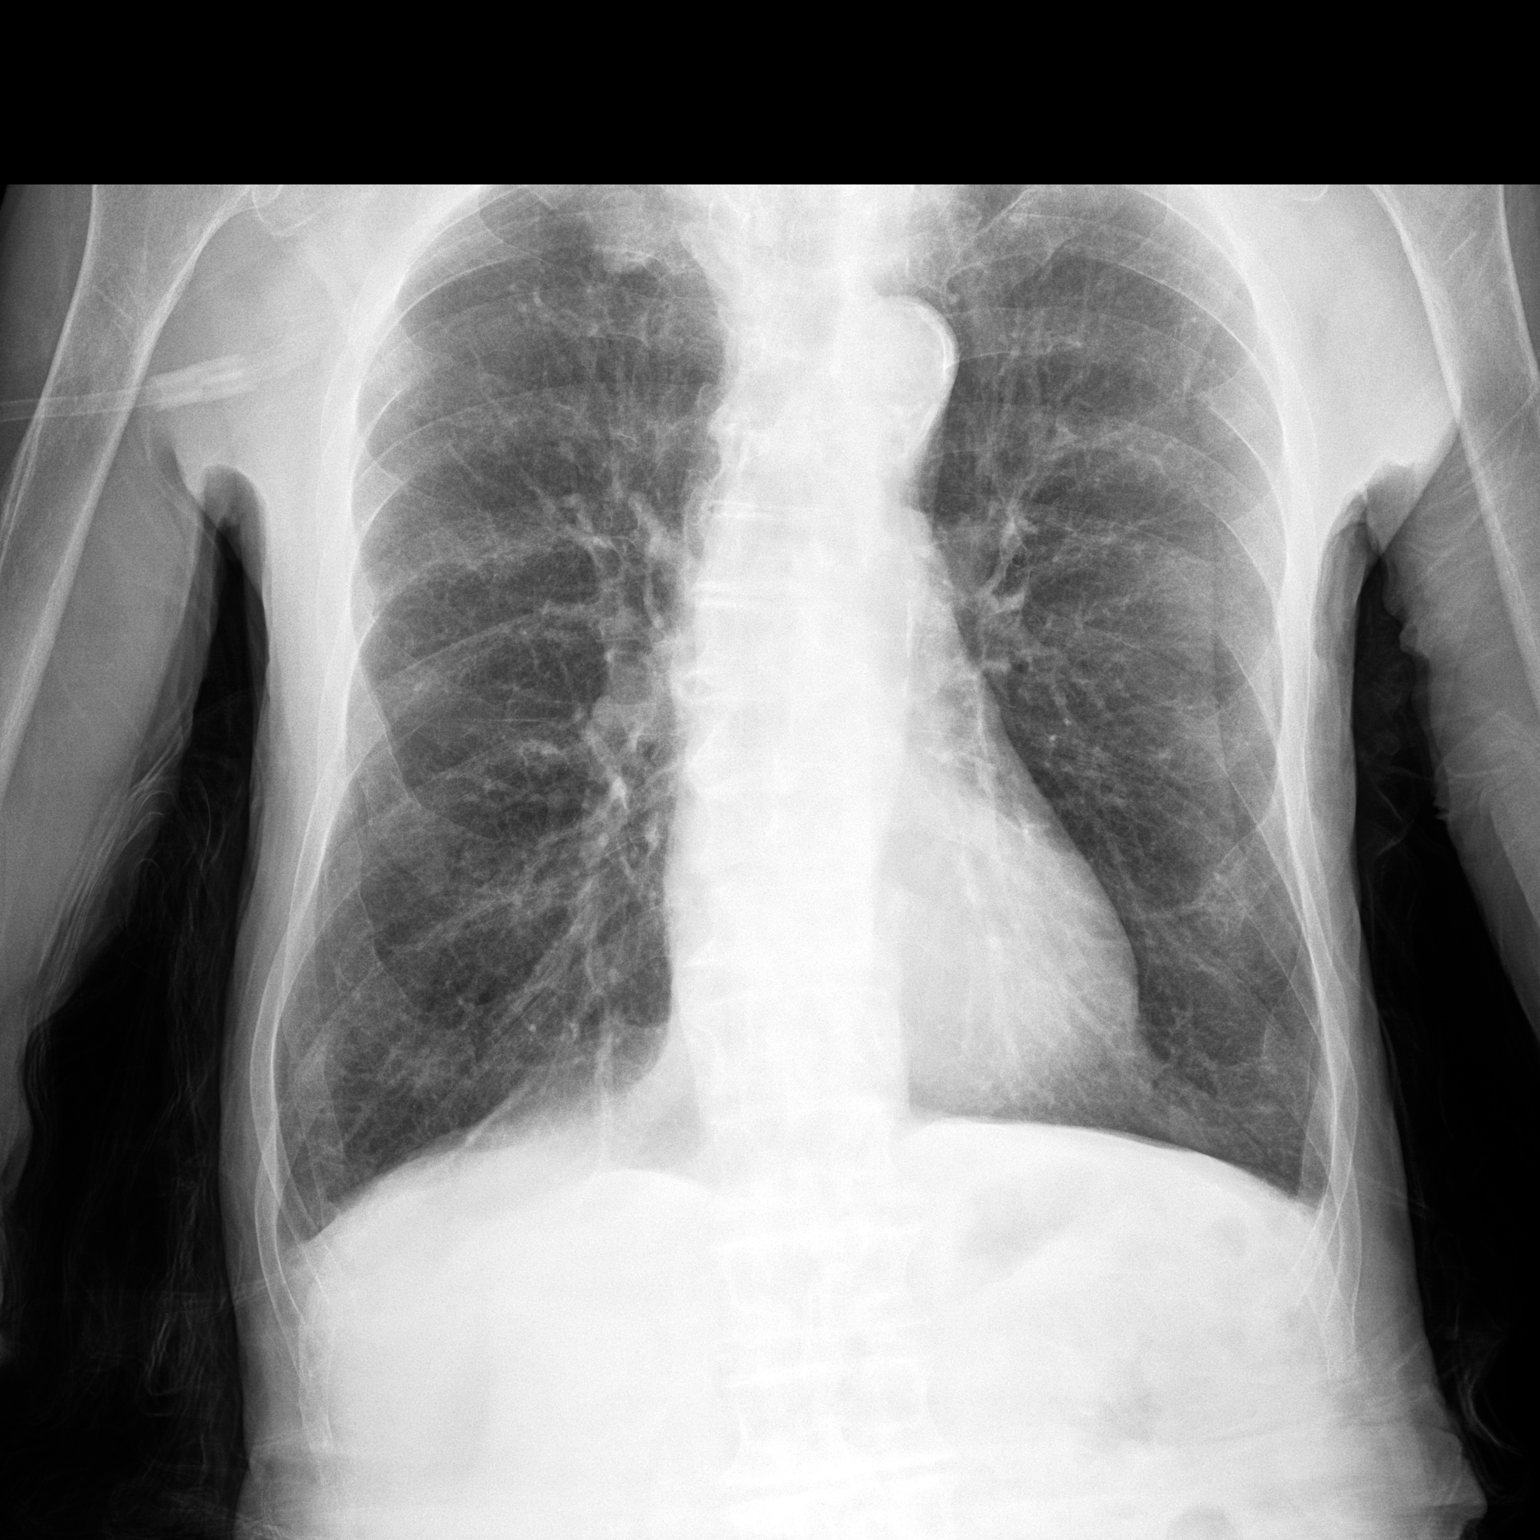

[1 of 1 positions shown; findings below may reference images not displayed]

FINDINGS: The lungs are hyperinflated but clear.  The cardiac silhouette is within normal limits  given portable technique.  No acute osseous abnormalities.
IMPRESSION: 
IMPRESSION: No acute cardiopulmonary process.

## 2022-10-10 IMAGING — CR XR HIP 2 OR 3 VW LEFT
1 series · 2 of 2 positions shown · non-contrast
Comparison: None available.

FINAL REPORT:
EXAM: XR PELVIS 1-2 VIEWS, XR HIP 2 OR 3 VW LEFT
INDICATION: fall

[Series 5370: AP · right · 2 of 2 slices shown]
[im 1/2]
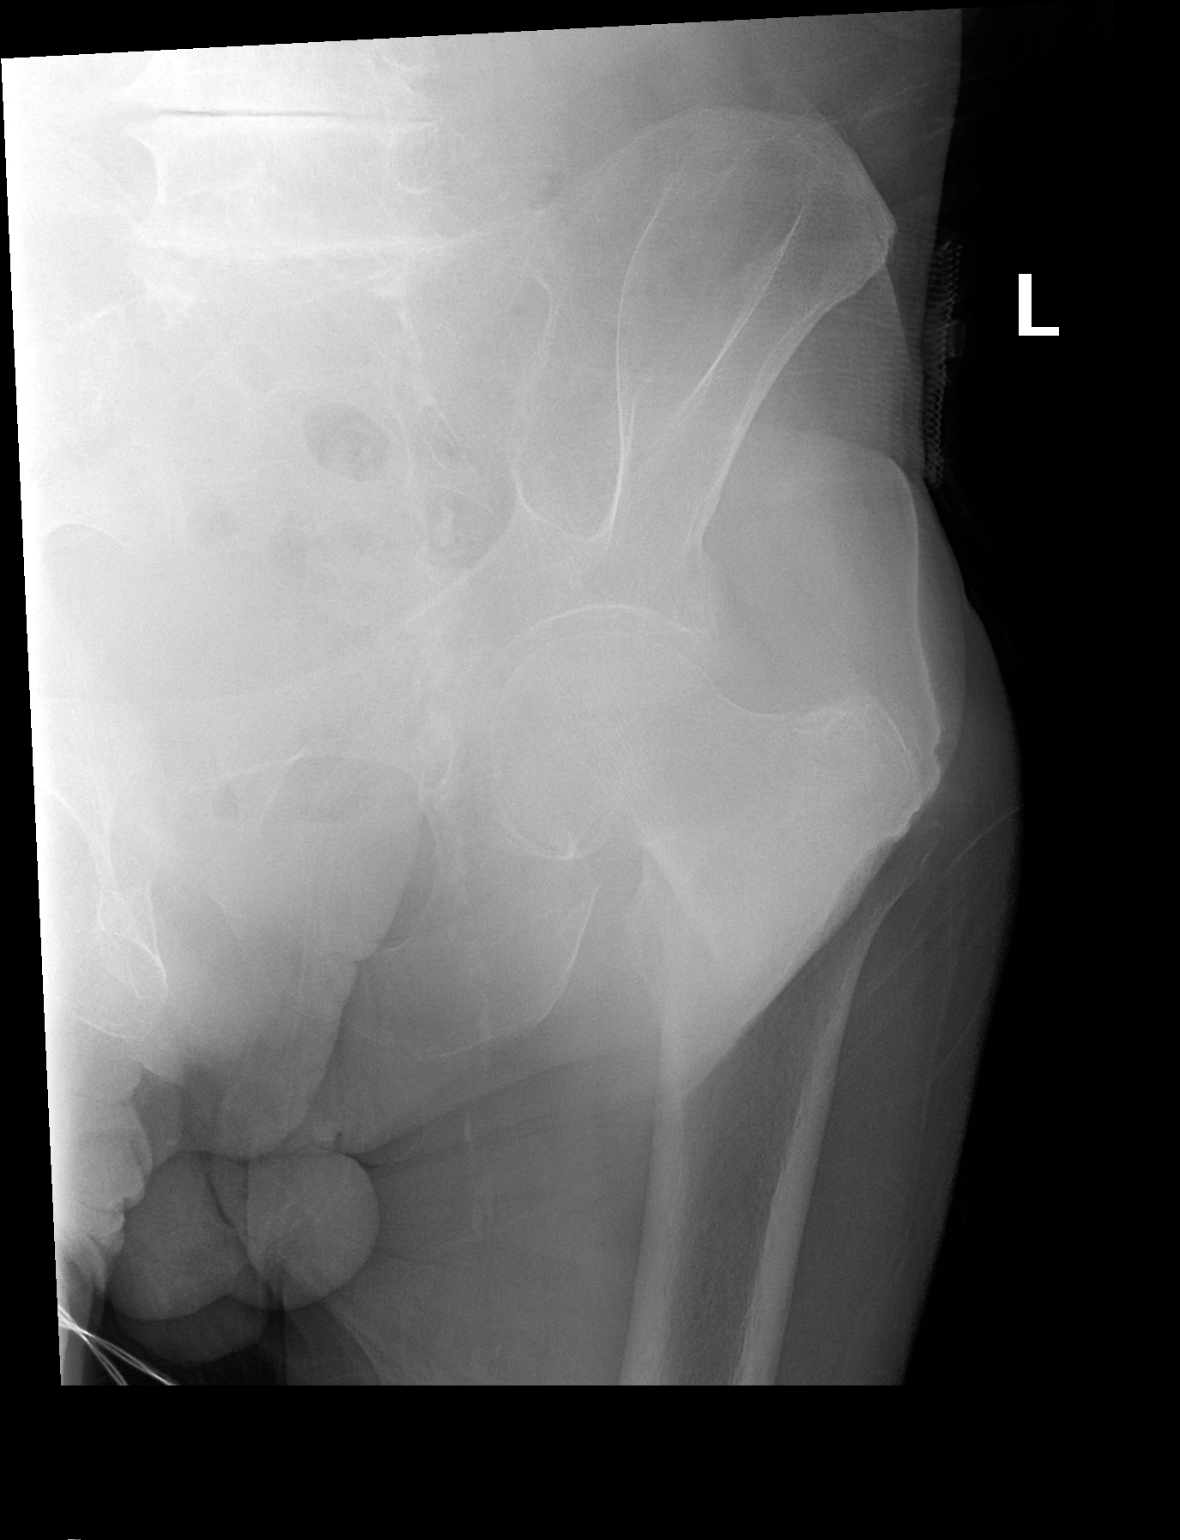
[im 2/2]
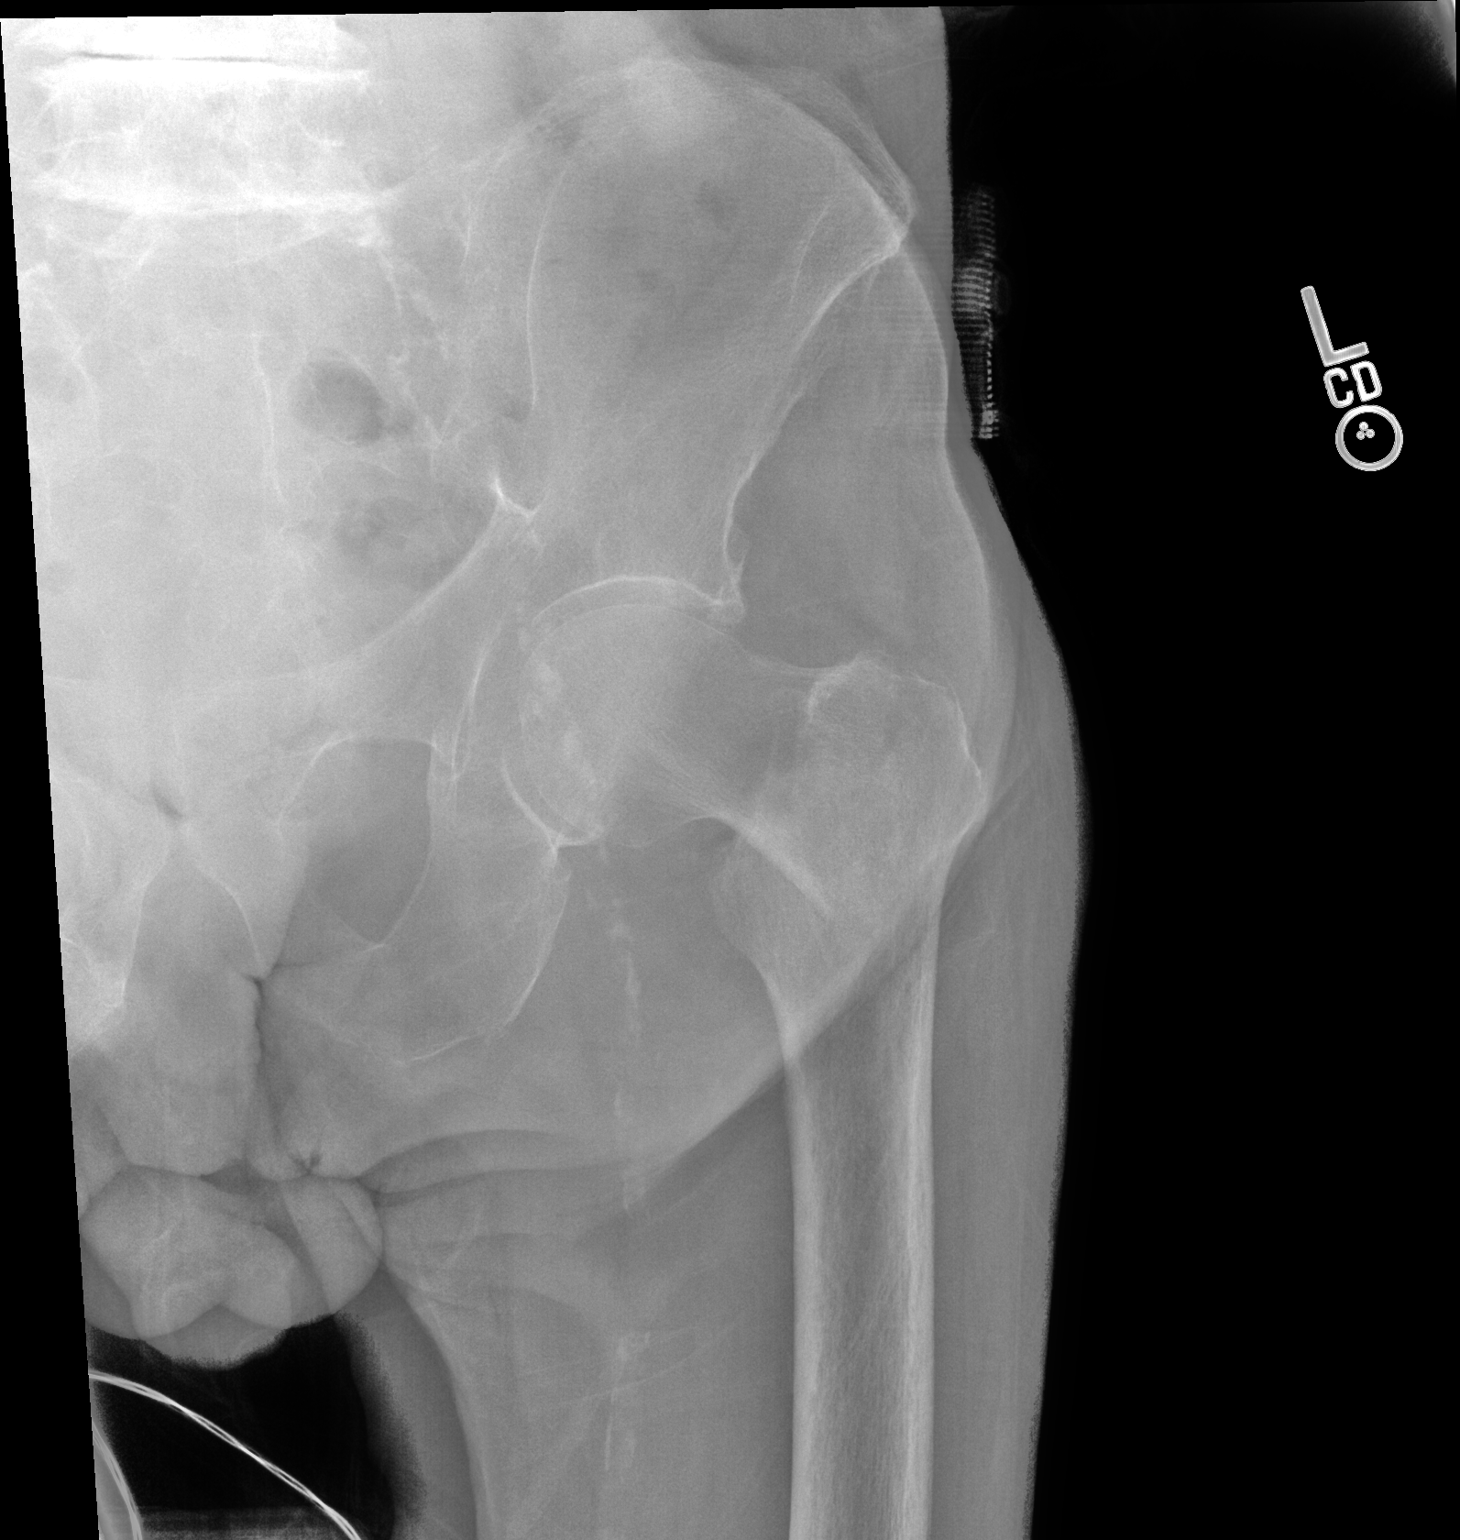

[2 of 2 positions shown; findings below may reference images not displayed]

IMPRESSION: FINDINGS/IMPRESSION:
Impacted left femoral fracture involving the basicervical and intertrochanteric regions. There is a 90 degrees apex lateral angulation.
No definite evidence of intra-articular extension. No additional fractures are noted.
Degenerative changes in the included lower lumbar spine. Bilateral hip arthrosis. Arterial calcinosis.

## 2022-10-10 IMAGING — CR XR PELVIS 1-2 VIEWS
1 series · 1 of 1 positions shown · non-contrast
Comparison: None available.

FINAL REPORT:
EXAM: XR PELVIS 1-2 VIEWS, XR HIP 2 OR 3 VW LEFT
INDICATION: fall

[AP]
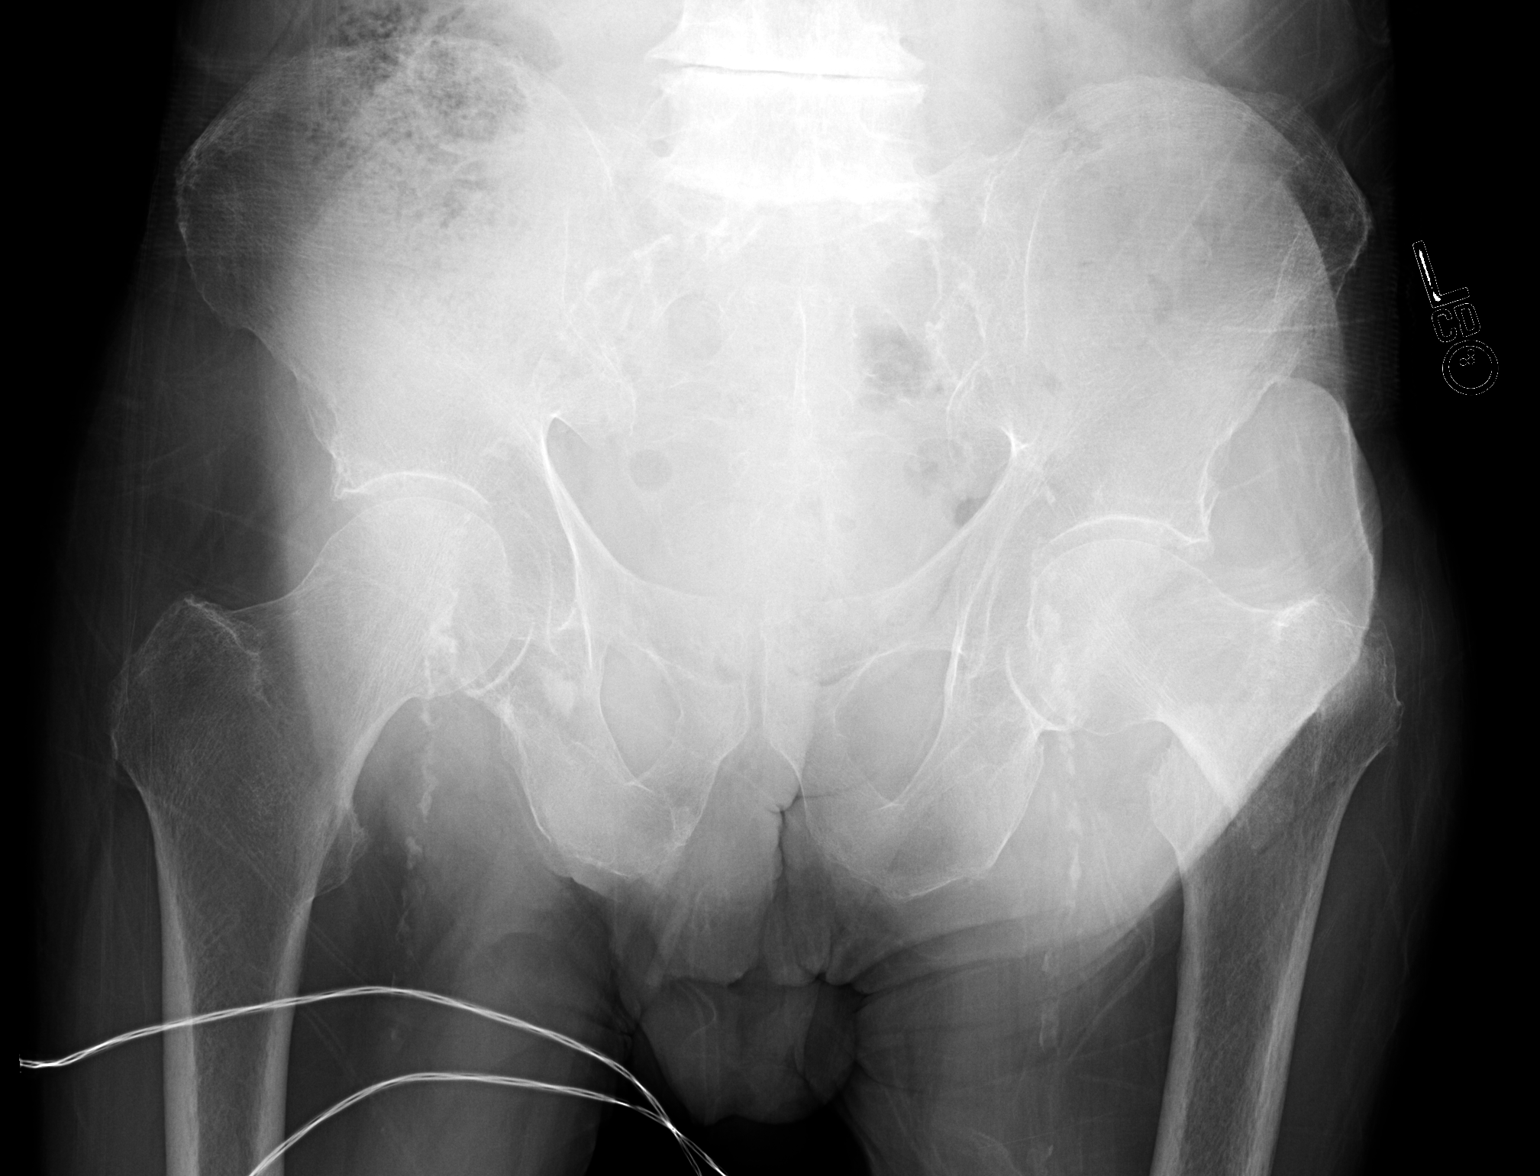

[1 of 1 positions shown; findings below may reference images not displayed]

IMPRESSION: FINDINGS/IMPRESSION:
Impacted left femoral fracture involving the basicervical and intertrochanteric regions. There is a 90 degrees apex lateral angulation.
No definite evidence of intra-articular extension. No additional fractures are noted.
Degenerative changes in the included lower lumbar spine. Bilateral hip arthrosis. Arterial calcinosis.

## 2023-01-18 IMAGING — MR MRI ABDOMEN WITH AND WITHOUT CONTRAST
19 of 21 series · 43 of 48 positions shown · non-contrast
Comparison: None available.

FINAL REPORT:
MRI ABDOMEN WITH AND WITHOUT CONTRAST
INDICATION: Renal neoplasm
TECHNIQUE: Multiplanar multisequence MRI of the abdomen was obtained before and after the intravenous administration of contrast.

[Series 1: survey · axial · 8.0mm · 0.84mm/px · 1 of 23 slices shown]
[im 1/23]
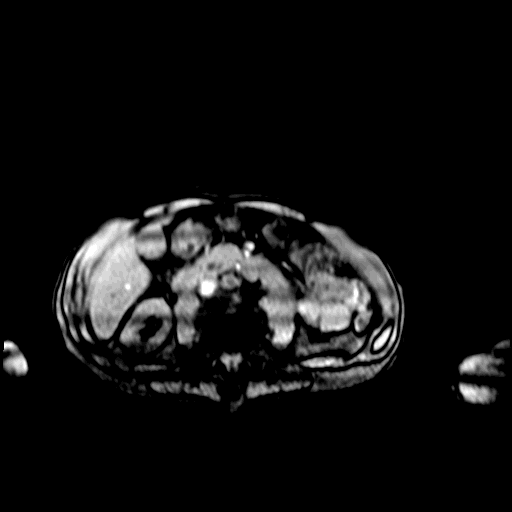

[Series 2: T2 · axial · 6.0mm · 1.48mm/px · 1 of 35 slices shown (1 of 2)]
[im 1/35]
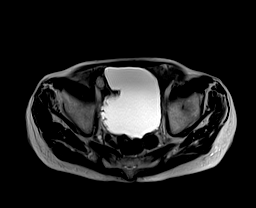

[Series 3: T2 fat-sat · axial · 6.0mm · 1.48mm/px · 1 of 32 slices shown]
[im 1/32]
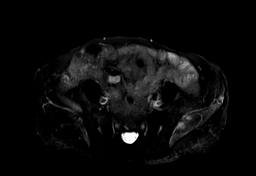

[Series 4: T2 · coronal · 6.0mm · 1.56mm/px · 1 of 29 slices shown (2 of 2)]
[im 1/29]
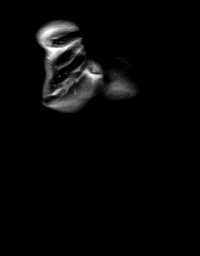

[Series 5: T1 dynamic · axial · 3.0mm · 1.19mm/px · z∈[-106,+128]mm · 4 of 160 slices shown (1 of 5)]
[im 1/160]
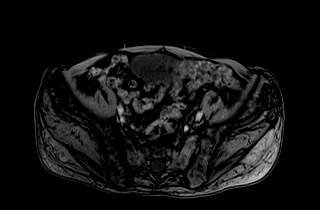
[im 54/160]
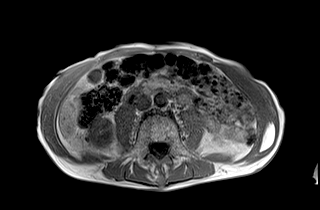
[im 107/160]
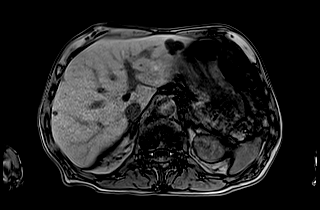
[im 160/160]
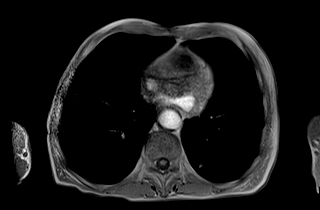

[Series 7: DWI · axial · 6.0mm · 1.46mm/px · 1 of 38 slices shown (1 of 5)]
[im 1/38]
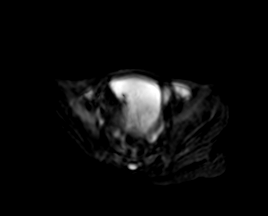

[Series 7: DWI · axial · 6.0mm · 1.46mm/px · 1 of 38 slices shown (2 of 5)]
[im 1/38]
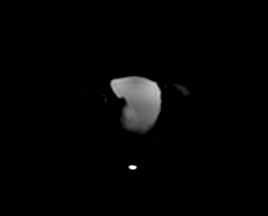

[Series 7: DWI · axial · 6.0mm · 1.46mm/px · 1 of 38 slices shown (3 of 5)]
[im 1/38]
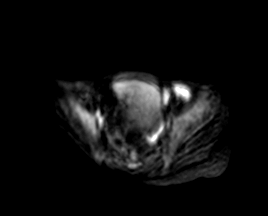

[Series 7: DWI · axial · 6.0mm · 1.46mm/px · z∈[-115,+149]mm · 3 of 114 slices shown (4 of 5)]
[im 1/114]
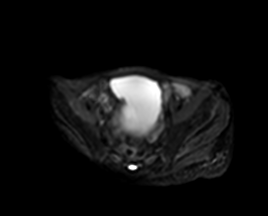
[im 57/114]
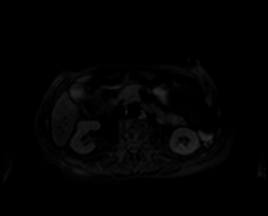
[im 114/114]
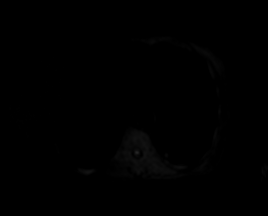

[Series 8: DWI · axial · 6.0mm · 1.46mm/px · 1 of 38 slices shown (5 of 5)]
[im 1/38]
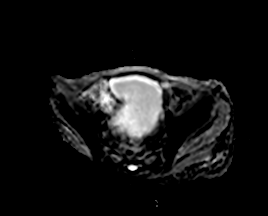

[Series 9: t2_trufi_cor_p2_free_breathing · coronal · 4.0mm · 0.79mm/px · 4 of 128 slices shown]
[im 1/128]
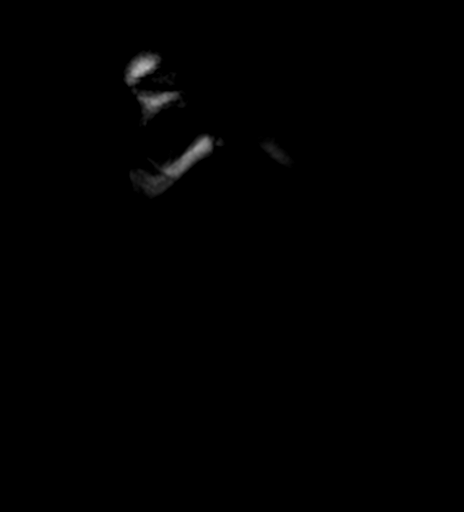
[im 43/128]
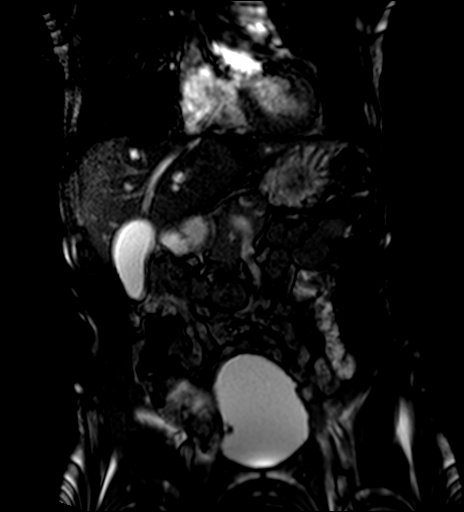
[im 85/128]
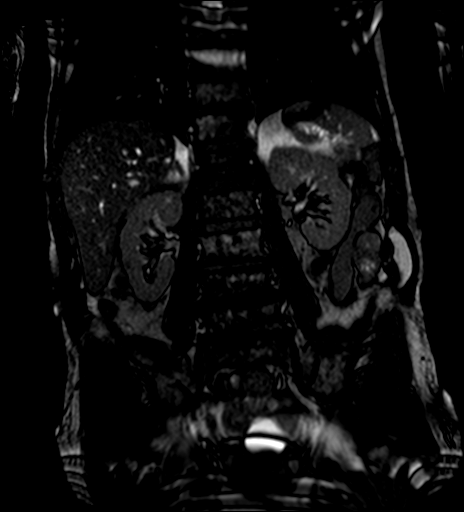
[im 128/128]
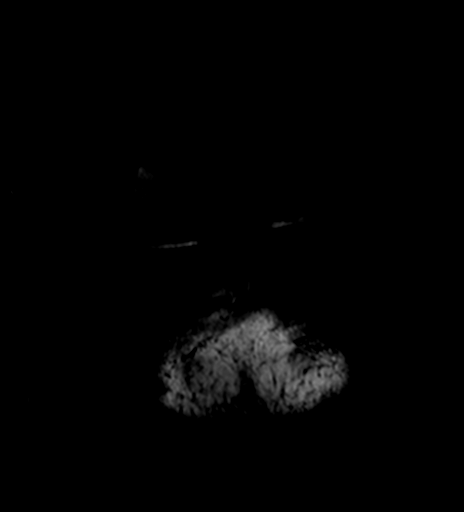

[Series 10: t2_trufi_ax_p2_free_breathing · axial · 4.0mm · 0.74mm/px · z∈[-73,+129]mm · 4 of 128 slices shown]
[im 1/128]
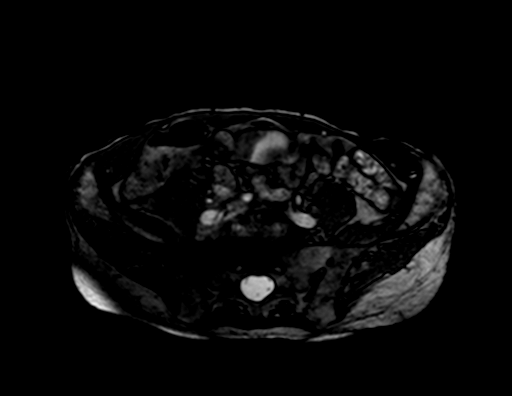
[im 43/128]
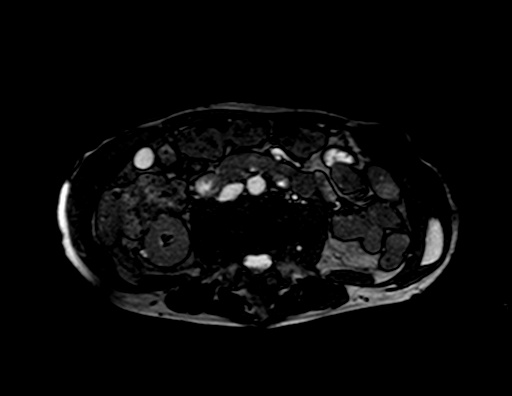
[im 85/128]
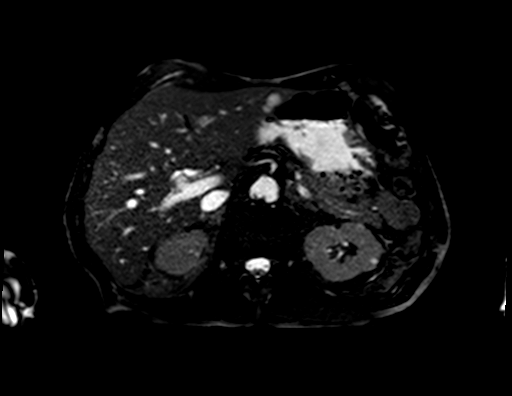
[im 128/128]
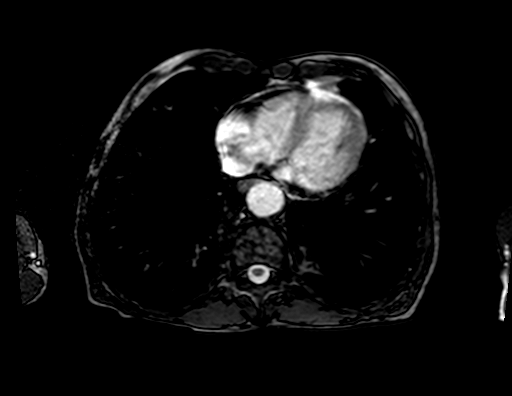

[Series 12: T1 dynamic · axial · 3.0mm · 1.19mm/px · z∈[-72,+164]mm · 3 of 80 slices shown (2 of 5)]
[im 1/80]
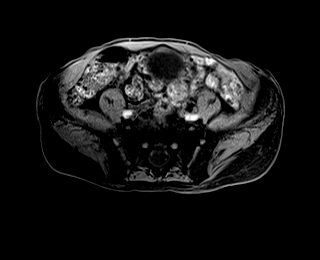
[im 40/80]
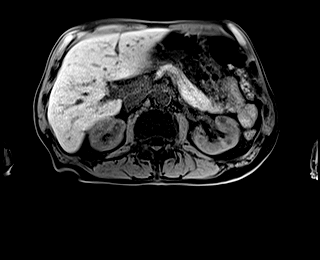
[im 80/80]
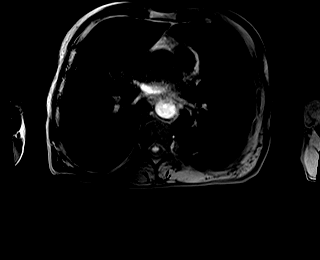

[Series 13: T1 dynamic · axial · 3.0mm · 1.19mm/px · z∈[-72,+164]mm · 3 of 80 slices shown (3 of 5)]
[im 1/80]
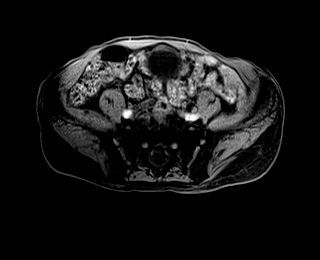
[im 40/80]
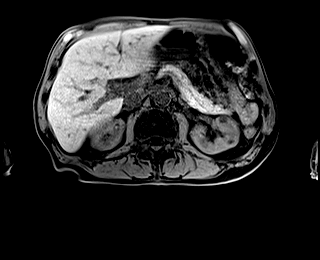
[im 80/80]
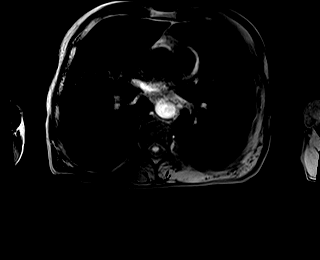

[Series 14: ax thrive_sub · axial · 3.0mm · 1.19mm/px · z∈[-72,+164]mm · 3 of 80 slices shown (1 of 3)]
[im 1/80]
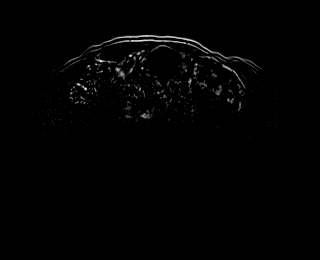
[im 40/80]
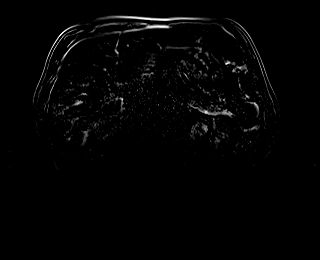
[im 80/80]
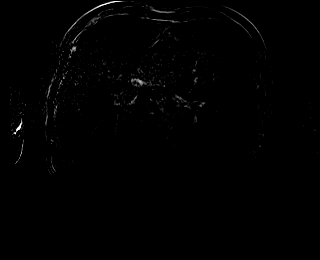

[Series 15: T1 dynamic · axial · 3.0mm · 1.19mm/px · z∈[-72,+164]mm · 3 of 80 slices shown (4 of 5)]
[im 1/80]
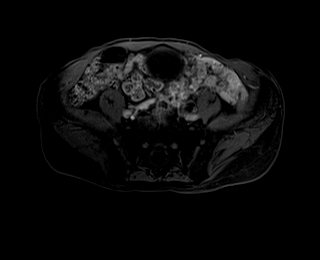
[im 40/80]
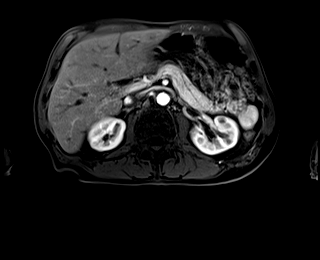
[im 80/80]
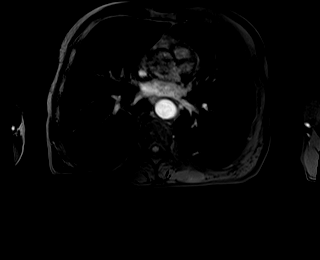

[Series 16: ax thrive_sub · axial · 3.0mm · 1.19mm/px · z∈[-72,+164]mm · 3 of 80 slices shown (2 of 3)]
[im 1/80]
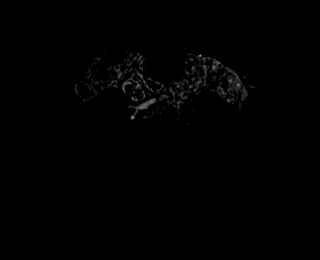
[im 40/80]
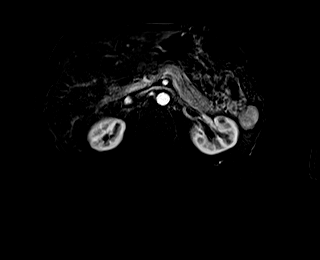
[im 80/80]
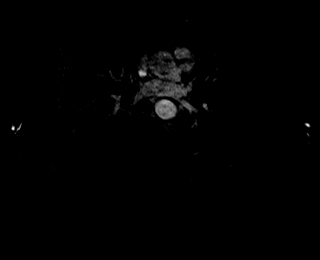

[Series 17: T1 dynamic · axial · 3.0mm · 1.19mm/px · z∈[-72,+164]mm · 3 of 80 slices shown (5 of 5)]
[im 1/80]
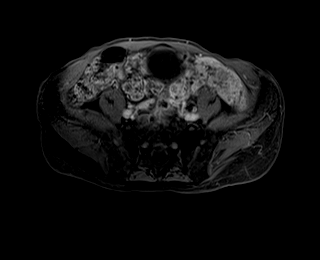
[im 40/80]
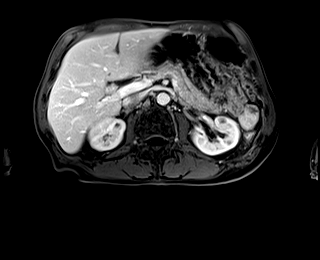
[im 80/80]
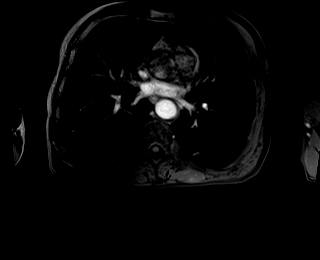

[Series 18: ax thrive_sub · axial · 3.0mm · 1.19mm/px · z∈[-72,+44]mm · 2 of 80 slices shown (3 of 3)]
[im 1/80]
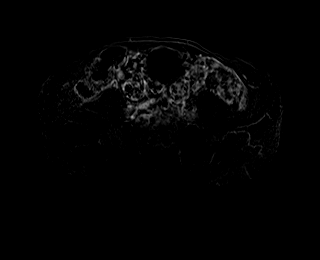
[im 40/80]
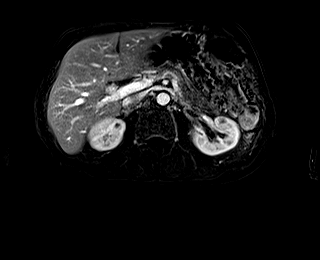

[43 of 48 positions shown; findings below may reference images not displayed]

FINDINGS: Lower thorax: No gross abnormality. Not well evaluated by MRI.
Liver: Multiple T1 hypointense, T2 hyperintense nonenhancing cystic foci are scattered throughout the liver. Majority appear to be simple. One within the left hepatic lobe is mildly septated and lobulated measuring up to 2.3 cm.
Gallbladder/biliary: Numerous gallstones are seen within the gallbladder. However no definitive choledocholithiasis. No intrahepatic or extrahepatic biliary ductal dilatation.
Spleen: Normal.
Pancreas: Normal.
Adrenal glands: Normal.
Kidneys: Multiple T1 hypointense, T2 hyperintense nonenhancing simple appearing cysts are scattered throughout bilateral kidneys. Largest simple appearing cyst within the mid right kidney measures up to 1.6 cm (series 3 image 14). Protruding off the lower pole right kidney is a T1 hyperintense, T2 hypointense lesion measuring up to 10 mm (series 12 image 13). This does not of the signal characteristics of a cyst. No definitive enhancing lesion seen from pre to postcontrast imaging within either kidney.
Abdominal wall: T1 hyperintense fat signal mass left lateral abdominal wall musculature measures 4.1 x 1.4 cm likely lipoma.
Vascular: Normal.
Mesentery: No adenopathy, free air, or free fluid.
Stomach/bowel: No bowel dilatation or wall thickening. Moderate to large stool seen throughout the colon.
Bones: No acute osseous abnormality.
IMPRESSION: 1. 10 mm T1 hyperintense, T2 hypointense lesion projects off the lower pole right kidney. This does not have the signal characteristics of a cyst. This may represent a hemorrhagic or proteinaceous cyst. However, a small low-grade solid hemorrhagic neoplasm cannot be excluded. Consider short-term follow-up MRI in 6-9 months to ensure stability.
2. Multiple simple appearing cysts scattered throughout the liver and bilateral kidneys.
3. Cholelithiasis but no choledocholithiasis.
